# Patient Record
Sex: Male | Born: 1960 | Race: White | Hispanic: No | Marital: Married
Health system: Southern US, Community
[De-identification: ages and names within clinical notes are randomized; demographics above are authoritative.]

---

## 2006-06-02 ENCOUNTER — Emergency Department (HOSPITAL_COMMUNITY): Admission: EM | Admit: 2006-06-02 | Discharge: 2006-06-02 | Payer: Self-pay | Admitting: Emergency Medicine

## 2013-01-15 ENCOUNTER — Emergency Department: Payer: Self-pay | Admitting: Emergency Medicine

## 2013-01-15 LAB — CBC
HCT: 42.5 % (ref 40.0–52.0)
MCH: 35 pg — ABNORMAL HIGH (ref 26.0–34.0)
MCV: 99 fL (ref 80–100)
Platelet: 210 10*3/uL (ref 150–440)
RBC: 4.29 10*6/uL — ABNORMAL LOW (ref 4.40–5.90)
RDW: 12.9 % (ref 11.5–14.5)

## 2013-01-15 LAB — BASIC METABOLIC PANEL
Chloride: 107 mmol/L (ref 98–107)
Creatinine: 0.98 mg/dL (ref 0.60–1.30)
EGFR (African American): >60
EGFR (Non-African Amer.): >60
Glucose: 126 mg/dL — ABNORMAL HIGH (ref 65–99)

## 2013-01-15 LAB — TROPONIN I: Troponin-I: <0.02 ng/mL

## 2013-01-15 LAB — CK TOTAL AND CKMB (NOT AT ARMC): CK, Total: 110 U/L (ref 35–232)

## 2013-02-16 ENCOUNTER — Ambulatory Visit: Payer: Self-pay | Admitting: Internal Medicine

## 2013-04-08 ENCOUNTER — Emergency Department: Payer: Self-pay | Admitting: Emergency Medicine

## 2013-04-08 LAB — BASIC METABOLIC PANEL
Anion Gap: 7 (ref 7–16)
BUN: 15 mg/dL (ref 7–18)
Chloride: 104 mmol/L (ref 98–107)
Co2: 25 mmol/L (ref 21–32)
EGFR (African American): >60
EGFR (Non-African Amer.): >60
Osmolality: 272 (ref 275–301)

## 2013-04-08 LAB — CBC
HCT: 47 % (ref 40.0–52.0)
HGB: 16.1 g/dL (ref 13.0–18.0)
MCH: 34.3 pg — ABNORMAL HIGH (ref 26.0–34.0)
RBC: 4.69 10*6/uL (ref 4.40–5.90)
WBC: 11.8 10*3/uL — ABNORMAL HIGH (ref 3.8–10.6)

## 2013-04-08 LAB — CK TOTAL AND CKMB (NOT AT ARMC): CK-MB: <0.5 ng/mL — ABNORMAL LOW (ref 0.5–3.6)

## 2013-06-01 ENCOUNTER — Emergency Department: Payer: Self-pay

## 2013-06-01 LAB — COMPREHENSIVE METABOLIC PANEL
Albumin: 3.7 g/dL (ref 3.4–5.0)
Alkaline Phosphatase: 70 U/L (ref 50–136)
Anion Gap: 6 — ABNORMAL LOW (ref 7–16)
BUN: 15 mg/dL (ref 7–18)
Calcium, Total: 8.9 mg/dL (ref 8.5–10.1)
Chloride: 105 mmol/L (ref 98–107)
Co2: 25 mmol/L (ref 21–32)
EGFR (African American): >60
Glucose: 99 mg/dL (ref 65–99)
Osmolality: 273 (ref 275–301)
Potassium: 3.9 mmol/L (ref 3.5–5.1)
Sodium: 136 mmol/L (ref 136–145)
Total Protein: 7.4 g/dL (ref 6.4–8.2)

## 2013-06-01 LAB — CBC
HCT: 42.1 % (ref 40.0–52.0)
HGB: 14.8 g/dL (ref 13.0–18.0)
MCH: 34.7 pg — ABNORMAL HIGH (ref 26.0–34.0)
Platelet: 226 10*3/uL (ref 150–440)
RBC: 4.27 10*6/uL — ABNORMAL LOW (ref 4.40–5.90)
WBC: 7.6 10*3/uL (ref 3.8–10.6)

## 2013-07-11 ENCOUNTER — Inpatient Hospital Stay: Payer: Self-pay | Admitting: Specialist

## 2013-07-11 LAB — DRUG SCREEN, URINE
Barbiturates, Ur Screen: NEGATIVE (ref ?–200)
Benzodiazepine, Ur Scrn: NEGATIVE (ref ?–200)
Cannabinoid 50 Ng, Ur ~~LOC~~: POSITIVE (ref ?–50)
Cocaine Metabolite,Ur ~~LOC~~: NEGATIVE (ref ?–300)
Tricyclic, Ur Screen: NEGATIVE (ref ?–1000)

## 2013-07-11 LAB — URINALYSIS, COMPLETE
Bilirubin,UR: NEGATIVE
Blood: NEGATIVE
Hyaline Cast: 1
Leukocyte Esterase: NEGATIVE
Ph: 5 (ref 4.5–8.0)
Specific Gravity: 1.004 (ref 1.003–1.030)
Squamous Epithelial: NONE SEEN
WBC UR: 1 /HPF (ref 0–5)

## 2013-07-11 LAB — CBC
HCT: 45.1 % (ref 40.0–52.0)
HGB: 16 g/dL (ref 13.0–18.0)
MCH: 34.7 pg — ABNORMAL HIGH (ref 26.0–34.0)
Platelet: 313 10*3/uL (ref 150–440)
RBC: 4.61 10*6/uL (ref 4.40–5.90)
WBC: 8.6 10*3/uL (ref 3.8–10.6)

## 2013-07-11 LAB — ETHANOL: Ethanol %: 0.137 % — ABNORMAL HIGH (ref 0.000–0.080)

## 2013-07-11 LAB — COMPREHENSIVE METABOLIC PANEL
Anion Gap: 10 (ref 7–16)
Creatinine: 0.94 mg/dL (ref 0.60–1.30)
Glucose: 115 mg/dL — ABNORMAL HIGH (ref 65–99)
Osmolality: 274 (ref 275–301)
SGPT (ALT): 36 U/L (ref 12–78)
Total Protein: 8.1 g/dL (ref 6.4–8.2)

## 2013-07-11 LAB — SALICYLATE LEVEL: Salicylates, Serum: 12.8 mg/dL — ABNORMAL HIGH

## 2013-07-12 LAB — COMPREHENSIVE METABOLIC PANEL
Albumin: 3.3 g/dL — ABNORMAL LOW (ref 3.4–5.0)
Anion Gap: 5 — ABNORMAL LOW (ref 7–16)
BUN: 10 mg/dL (ref 7–18)
Bilirubin,Total: 0.4 mg/dL (ref 0.2–1.0)
Calcium, Total: 7.8 mg/dL — ABNORMAL LOW (ref 8.5–10.1)
Co2: 26 mmol/L (ref 21–32)
EGFR (African American): >60
Osmolality: 279 (ref 275–301)
Potassium: 4 mmol/L (ref 3.5–5.1)

## 2013-07-12 LAB — CBC WITH DIFFERENTIAL/PLATELET
Basophil #: 0 10*3/uL (ref 0.0–0.1)
Eosinophil #: 0.2 10*3/uL (ref 0.0–0.7)
HCT: 41 % (ref 40.0–52.0)
Lymphocyte #: 1.4 10*3/uL (ref 1.0–3.6)
Lymphocyte %: 19.6 %
Monocyte %: 7.4 %
Neutrophil %: 70.7 %
Platelet: 234 10*3/uL (ref 150–440)
RBC: 4.14 10*6/uL — ABNORMAL LOW (ref 4.40–5.90)
RDW: 13.5 % (ref 11.5–14.5)
WBC: 7.4 10*3/uL (ref 3.8–10.6)

## 2013-07-13 LAB — BASIC METABOLIC PANEL
Anion Gap: 5 — ABNORMAL LOW (ref 7–16)
Calcium, Total: 7.6 mg/dL — ABNORMAL LOW (ref 8.5–10.1)
Co2: 26 mmol/L (ref 21–32)
Creatinine: 0.89 mg/dL (ref 0.60–1.30)
EGFR (African American): >60
Osmolality: 276 (ref 275–301)

## 2013-07-13 LAB — CBC WITH DIFFERENTIAL/PLATELET
HCT: 38.7 % — ABNORMAL LOW (ref 40.0–52.0)
HGB: 13.8 g/dL (ref 13.0–18.0)
Lymphocyte #: 1.4 10*3/uL (ref 1.0–3.6)
Lymphocyte %: 22.5 %
Monocyte %: 8.9 %
Neutrophil #: 4 10*3/uL (ref 1.4–6.5)
Platelet: 214 10*3/uL (ref 150–440)
RDW: 13.2 % (ref 11.5–14.5)
WBC: 6.3 10*3/uL (ref 3.8–10.6)

## 2013-07-14 LAB — CBC WITH DIFFERENTIAL/PLATELET
Basophil #: 0.2 10*3/uL — ABNORMAL HIGH (ref 0.0–0.1)
Basophil %: 2.3 %
Eosinophil #: 0.2 10*3/uL (ref 0.0–0.7)
Eosinophil %: 2.7 %
Lymphocyte %: 16.6 %
MCV: 97 fL (ref 80–100)
Monocyte #: 0.5 x10 3/mm (ref 0.2–1.0)
Neutrophil %: 72.1 %
RDW: 13.1 % (ref 11.5–14.5)

## 2013-07-14 LAB — BASIC METABOLIC PANEL
BUN: 7 mg/dL (ref 7–18)
Calcium, Total: 7.7 mg/dL — ABNORMAL LOW (ref 8.5–10.1)
Co2: 24 mmol/L (ref 21–32)
Creatinine: 0.79 mg/dL (ref 0.60–1.30)
EGFR (Non-African Amer.): >60
Osmolality: 276 (ref 275–301)
Sodium: 138 mmol/L (ref 136–145)

## 2013-07-16 LAB — BASIC METABOLIC PANEL
Calcium, Total: 8.2 mg/dL — ABNORMAL LOW (ref 8.5–10.1)
Chloride: 107 mmol/L (ref 98–107)
Creatinine: 0.78 mg/dL (ref 0.60–1.30)
EGFR (African American): >60
EGFR (Non-African Amer.): >60
Potassium: 4.7 mmol/L (ref 3.5–5.1)

## 2013-07-16 LAB — MAGNESIUM: Magnesium: 2.3 mg/dL

## 2013-07-16 LAB — TRIGLYCERIDES: Triglycerides: 912 mg/dL — ABNORMAL HIGH (ref 0–200)

## 2013-07-17 LAB — SODIUM: Sodium: 142 mmol/L (ref 136–145)

## 2013-07-18 LAB — CBC WITH DIFFERENTIAL/PLATELET
Basophil #: 0 10*3/uL (ref 0.0–0.1)
Eosinophil #: 0 10*3/uL (ref 0.0–0.7)
Eosinophil %: 0 %
Lymphocyte #: 0.5 10*3/uL — ABNORMAL LOW (ref 1.0–3.6)
Lymphocyte %: 4.5 %
MCH: 34.4 pg — ABNORMAL HIGH (ref 26.0–34.0)
MCHC: 34.6 g/dL (ref 32.0–36.0)
MCV: 99 fL (ref 80–100)
Neutrophil #: 9.2 10*3/uL — ABNORMAL HIGH (ref 1.4–6.5)
Neutrophil %: 89.6 %
Platelet: 189 10*3/uL (ref 150–440)
RBC: 3.75 10*6/uL — ABNORMAL LOW (ref 4.40–5.90)
RDW: 12.7 % (ref 11.5–14.5)
WBC: 10.2 10*3/uL (ref 3.8–10.6)

## 2013-07-18 LAB — COMPREHENSIVE METABOLIC PANEL
Albumin: 2.9 g/dL — ABNORMAL LOW (ref 3.4–5.0)
Alkaline Phosphatase: 63 U/L (ref 50–136)
Anion Gap: 1 — ABNORMAL LOW (ref 7–16)
Calcium, Total: 8.6 mg/dL (ref 8.5–10.1)
Co2: 35 mmol/L — ABNORMAL HIGH (ref 21–32)
Creatinine: 0.68 mg/dL (ref 0.60–1.30)
EGFR (African American): >60
Osmolality: 285 (ref 275–301)
SGOT(AST): 94 U/L — ABNORMAL HIGH (ref 15–37)
SGPT (ALT): 75 U/L (ref 12–78)
Sodium: 138 mmol/L (ref 136–145)

## 2013-07-18 LAB — MAGNESIUM: Magnesium: 2.3 mg/dL

## 2013-07-20 LAB — BASIC METABOLIC PANEL
Anion Gap: 9 (ref 7–16)
Calcium, Total: 9.4 mg/dL (ref 8.5–10.1)
Chloride: 95 mmol/L — ABNORMAL LOW (ref 98–107)
Creatinine: 0.75 mg/dL (ref 0.60–1.30)
EGFR (African American): >60
EGFR (Non-African Amer.): >60
Glucose: 170 mg/dL — ABNORMAL HIGH (ref 65–99)
Osmolality: 274 (ref 275–301)
Potassium: 3.5 mmol/L (ref 3.5–5.1)

## 2013-07-20 LAB — MAGNESIUM: Magnesium: 2 mg/dL

## 2013-07-21 LAB — BASIC METABOLIC PANEL
BUN: 35 mg/dL — ABNORMAL HIGH (ref 7–18)
Calcium, Total: 9.1 mg/dL (ref 8.5–10.1)
Creatinine: 0.87 mg/dL (ref 0.60–1.30)
EGFR (African American): >60

## 2013-07-22 ENCOUNTER — Inpatient Hospital Stay: Payer: Self-pay | Admitting: Psychiatry

## 2013-07-22 LAB — POTASSIUM: Potassium: 3.7 mmol/L (ref 3.5–5.1)

## 2013-09-16 ENCOUNTER — Emergency Department: Payer: Self-pay | Admitting: Emergency Medicine

## 2013-09-16 LAB — CBC
HCT: 45.4 % (ref 40.0–52.0)
HGB: 16 g/dL (ref 13.0–18.0)
MCV: 96 fL (ref 80–100)
RBC: 4.74 10*6/uL (ref 4.40–5.90)

## 2013-09-16 LAB — URINALYSIS, COMPLETE
Bacteria: NONE SEEN
Bilirubin,UR: NEGATIVE
Blood: NEGATIVE
Glucose,UR: NEGATIVE mg/dL (ref 0–75)
Ketone: NEGATIVE
Leukocyte Esterase: NEGATIVE
Nitrite: NEGATIVE
Ph: 5 (ref 4.5–8.0)
Specific Gravity: 1.003 (ref 1.003–1.030)

## 2013-09-16 LAB — DRUG SCREEN, URINE
Amphetamines, Ur Screen: NEGATIVE (ref ?–1000)
Cannabinoid 50 Ng, Ur ~~LOC~~: NEGATIVE (ref ?–50)
Cocaine Metabolite,Ur ~~LOC~~: NEGATIVE (ref ?–300)
Opiate, Ur Screen: NEGATIVE (ref ?–300)
Phencyclidine (PCP) Ur S: NEGATIVE (ref ?–25)

## 2013-09-16 LAB — COMPREHENSIVE METABOLIC PANEL
Albumin: 4.3 g/dL (ref 3.4–5.0)
Anion Gap: 11 (ref 7–16)
BUN: 12 mg/dL (ref 7–18)
Bilirubin,Total: 0.5 mg/dL (ref 0.2–1.0)
Calcium, Total: 9.1 mg/dL (ref 8.5–10.1)
Chloride: 106 mmol/L (ref 98–107)
Co2: 21 mmol/L (ref 21–32)
EGFR (African American): >60
Osmolality: 276 (ref 275–301)
SGPT (ALT): 40 U/L (ref 12–78)
Sodium: 138 mmol/L (ref 136–145)
Total Protein: 8.2 g/dL (ref 6.4–8.2)

## 2013-09-16 LAB — ETHANOL: Ethanol: 99 mg/dL

## 2013-09-17 LAB — SALICYLATE LEVEL: Salicylates, Serum: 10.7 mg/dL — ABNORMAL HIGH

## 2013-12-20 ENCOUNTER — Emergency Department: Payer: Self-pay | Admitting: Emergency Medicine

## 2013-12-20 LAB — CBC WITH DIFFERENTIAL/PLATELET
Eosinophil #: 0.1 10*3/uL (ref 0.0–0.7)
Lymphocyte %: 17.3 %
MCH: 33.2 pg (ref 26.0–34.0)
MCHC: 35 g/dL (ref 32.0–36.0)
MCV: 95 fL (ref 80–100)
Monocyte #: 0.5 x10 3/mm (ref 0.2–1.0)
Monocyte %: 5.4 %
Neutrophil #: 7.6 10*3/uL — ABNORMAL HIGH (ref 1.4–6.5)
RBC: 4.93 10*6/uL (ref 4.40–5.90)

## 2013-12-20 LAB — BASIC METABOLIC PANEL
Anion Gap: 4 — ABNORMAL LOW (ref 7–16)
Chloride: 100 mmol/L (ref 98–107)
EGFR (Non-African Amer.): >60
Osmolality: 266 (ref 275–301)
Potassium: 3.6 mmol/L (ref 3.5–5.1)
Sodium: 130 mmol/L — ABNORMAL LOW (ref 136–145)

## 2013-12-20 LAB — CK: CK, Total: 77 U/L (ref 35–232)

## 2014-01-18 LAB — DRUG SCREEN, URINE
Amphetamines, Ur Screen: NEGATIVE (ref ?–1000)
BARBITURATES, UR SCREEN: NEGATIVE (ref ?–200)
Benzodiazepine, Ur Scrn: POSITIVE (ref ?–200)
CANNABINOID 50 NG, UR ~~LOC~~: NEGATIVE (ref ?–50)
COCAINE METABOLITE, UR ~~LOC~~: NEGATIVE (ref ?–300)
MDMA (Ecstasy)Ur Screen: NEGATIVE (ref ?–500)
Methadone, Ur Screen: NEGATIVE (ref ?–300)
Opiate, Ur Screen: NEGATIVE (ref ?–300)
Phencyclidine (PCP) Ur S: NEGATIVE (ref ?–25)
TRICYCLIC, UR SCREEN: NEGATIVE (ref ?–1000)

## 2014-01-18 LAB — ETHANOL: Ethanol %: <0.003 % (ref 0.000–0.080)

## 2014-01-18 LAB — CBC
HCT: 51.3 % (ref 40.0–52.0)
HGB: 17.9 g/dL (ref 13.0–18.0)
MCH: 33.8 pg (ref 26.0–34.0)
MCHC: 34.8 g/dL (ref 32.0–36.0)
MCV: 97 fL (ref 80–100)
Platelet: 295 10*3/uL (ref 150–440)
RBC: 5.29 10*6/uL (ref 4.40–5.90)
RDW: 14.2 % (ref 11.5–14.5)
WBC: 7.8 10*3/uL (ref 3.8–10.6)

## 2014-01-18 LAB — COMPREHENSIVE METABOLIC PANEL
ALBUMIN: 4.8 g/dL (ref 3.4–5.0)
ANION GAP: 3 — AB (ref 7–16)
AST: 32 U/L (ref 15–37)
Alkaline Phosphatase: 99 U/L
BUN: 28 mg/dL — ABNORMAL HIGH (ref 7–18)
Bilirubin,Total: 0.8 mg/dL (ref 0.2–1.0)
CO2: 31 mmol/L (ref 21–32)
Calcium, Total: 9.3 mg/dL (ref 8.5–10.1)
Chloride: 97 mmol/L — ABNORMAL LOW (ref 98–107)
Creatinine: 1.33 mg/dL — ABNORMAL HIGH (ref 0.60–1.30)
EGFR (African American): >60
GLUCOSE: 100 mg/dL — AB (ref 65–99)
OSMOLALITY: 268 (ref 275–301)
Potassium: 3.7 mmol/L (ref 3.5–5.1)
SGPT (ALT): 31 U/L (ref 12–78)
Sodium: 131 mmol/L — ABNORMAL LOW (ref 136–145)
Total Protein: 8.7 g/dL — ABNORMAL HIGH (ref 6.4–8.2)

## 2014-01-18 LAB — URINALYSIS, COMPLETE
BLOOD: NEGATIVE
Bacteria: NONE SEEN
Bilirubin,UR: NEGATIVE
Glucose,UR: NEGATIVE mg/dL (ref 0–75)
KETONE: NEGATIVE
LEUKOCYTE ESTERASE: NEGATIVE
Nitrite: NEGATIVE
PH: 5 (ref 4.5–8.0)
PROTEIN: NEGATIVE
Specific Gravity: 1.011 (ref 1.003–1.030)
Squamous Epithelial: <1
WBC UR: <1 /HPF (ref 0–5)

## 2014-01-18 LAB — SALICYLATE LEVEL: Salicylates, Serum: 11 mg/dL — ABNORMAL HIGH

## 2014-01-18 LAB — TSH: Thyroid Stimulating Horm: 2.52 u[IU]/mL

## 2014-01-18 LAB — ACETAMINOPHEN LEVEL

## 2014-01-19 ENCOUNTER — Inpatient Hospital Stay: Payer: Self-pay | Admitting: Psychiatry

## 2014-04-16 ENCOUNTER — Emergency Department: Payer: Self-pay | Admitting: Emergency Medicine

## 2014-04-16 LAB — COMPREHENSIVE METABOLIC PANEL
ALBUMIN: 3.8 g/dL (ref 3.4–5.0)
ANION GAP: 7 (ref 7–16)
Alkaline Phosphatase: 81 U/L
BUN: 15 mg/dL (ref 7–18)
Bilirubin,Total: 0.3 mg/dL (ref 0.2–1.0)
CREATININE: 0.99 mg/dL (ref 0.60–1.30)
Calcium, Total: 8.9 mg/dL (ref 8.5–10.1)
Chloride: 103 mmol/L (ref 98–107)
Co2: 27 mmol/L (ref 21–32)
Glucose: 153 mg/dL — ABNORMAL HIGH (ref 65–99)
Osmolality: 278 (ref 275–301)
POTASSIUM: 3.7 mmol/L (ref 3.5–5.1)
SGOT(AST): 24 U/L (ref 15–37)
SGPT (ALT): 35 U/L (ref 12–78)
Sodium: 137 mmol/L (ref 136–145)
Total Protein: 7.4 g/dL (ref 6.4–8.2)

## 2014-04-16 LAB — CBC
HCT: 44.4 % (ref 40.0–52.0)
HGB: 15.4 g/dL (ref 13.0–18.0)
MCH: 34.3 pg — AB (ref 26.0–34.0)
MCHC: 34.7 g/dL (ref 32.0–36.0)
MCV: 99 fL (ref 80–100)
PLATELETS: 225 10*3/uL (ref 150–440)
RBC: 4.49 10*6/uL (ref 4.40–5.90)
RDW: 14 % (ref 11.5–14.5)
WBC: 7.5 10*3/uL (ref 3.8–10.6)

## 2014-04-16 LAB — ACETAMINOPHEN LEVEL

## 2014-04-16 LAB — DRUG SCREEN, URINE
Amphetamines, Ur Screen: NEGATIVE (ref ?–1000)
Barbiturates, Ur Screen: NEGATIVE (ref ?–200)
Benzodiazepine, Ur Scrn: POSITIVE (ref ?–200)
Cannabinoid 50 Ng, Ur ~~LOC~~: POSITIVE (ref ?–50)
Cocaine Metabolite,Ur ~~LOC~~: NEGATIVE (ref ?–300)
MDMA (ECSTASY) UR SCREEN: NEGATIVE (ref ?–500)
METHADONE, UR SCREEN: NEGATIVE (ref ?–300)
Opiate, Ur Screen: NEGATIVE (ref ?–300)
PHENCYCLIDINE (PCP) UR S: NEGATIVE (ref ?–25)
Tricyclic, Ur Screen: NEGATIVE (ref ?–1000)

## 2014-04-16 LAB — ETHANOL: Ethanol %: <0.003 % (ref 0.000–0.080)

## 2014-04-16 LAB — SALICYLATE LEVEL
SALICYLATES, SERUM: 4.6 mg/dL — AB
Salicylates, Serum: 4.3 mg/dL — ABNORMAL HIGH

## 2014-04-16 LAB — TSH: Thyroid Stimulating Horm: 3.18 u[IU]/mL

## 2014-04-17 ENCOUNTER — Emergency Department: Payer: Self-pay | Admitting: Emergency Medicine

## 2014-04-17 LAB — CBC
HCT: 47.3 % (ref 40.0–52.0)
HGB: 16.4 g/dL (ref 13.0–18.0)
MCH: 33.8 pg (ref 26.0–34.0)
MCHC: 34.6 g/dL (ref 32.0–36.0)
MCV: 98 fL (ref 80–100)
PLATELETS: 257 10*3/uL (ref 150–440)
RBC: 4.86 10*6/uL (ref 4.40–5.90)
RDW: 13.8 % (ref 11.5–14.5)
WBC: 7.3 10*3/uL (ref 3.8–10.6)

## 2014-04-17 LAB — COMPREHENSIVE METABOLIC PANEL
ALBUMIN: 4.4 g/dL (ref 3.4–5.0)
AST: 37 U/L (ref 15–37)
Alkaline Phosphatase: 87 U/L
Anion Gap: 8 (ref 7–16)
BUN: 13 mg/dL (ref 7–18)
Bilirubin,Total: 0.5 mg/dL (ref 0.2–1.0)
CHLORIDE: 102 mmol/L (ref 98–107)
CO2: 27 mmol/L (ref 21–32)
Calcium, Total: 9 mg/dL (ref 8.5–10.1)
Creatinine: 0.7 mg/dL (ref 0.60–1.30)
EGFR (African American): >60
EGFR (Non-African Amer.): >60
Glucose: 120 mg/dL — ABNORMAL HIGH (ref 65–99)
OSMOLALITY: 275 (ref 275–301)
Potassium: 3.4 mmol/L — ABNORMAL LOW (ref 3.5–5.1)
SGPT (ALT): 40 U/L (ref 12–78)
Sodium: 137 mmol/L (ref 136–145)
TOTAL PROTEIN: 8.2 g/dL (ref 6.4–8.2)

## 2014-04-17 LAB — URINALYSIS, COMPLETE
BILIRUBIN, UR: NEGATIVE
Bacteria: NONE SEEN
Blood: NEGATIVE
Glucose,UR: NEGATIVE mg/dL (ref 0–75)
Ketone: NEGATIVE
LEUKOCYTE ESTERASE: NEGATIVE
Nitrite: NEGATIVE
PROTEIN: NEGATIVE
Ph: 5 (ref 4.5–8.0)
RBC, UR: NONE SEEN /HPF (ref 0–5)
SPECIFIC GRAVITY: 1.003 (ref 1.003–1.030)
SQUAMOUS EPITHELIAL: NONE SEEN
WBC UR: NONE SEEN /HPF (ref 0–5)

## 2014-04-17 LAB — DRUG SCREEN, URINE
Amphetamines, Ur Screen: NEGATIVE (ref ?–1000)
BENZODIAZEPINE, UR SCRN: NEGATIVE (ref ?–200)
Barbiturates, Ur Screen: NEGATIVE (ref ?–200)
CANNABINOID 50 NG, UR ~~LOC~~: POSITIVE (ref ?–50)
COCAINE METABOLITE, UR ~~LOC~~: NEGATIVE (ref ?–300)
MDMA (ECSTASY) UR SCREEN: NEGATIVE (ref ?–500)
Methadone, Ur Screen: NEGATIVE (ref ?–300)
Opiate, Ur Screen: NEGATIVE (ref ?–300)
Phencyclidine (PCP) Ur S: NEGATIVE (ref ?–25)
Tricyclic, Ur Screen: NEGATIVE (ref ?–1000)

## 2014-04-17 LAB — ACETAMINOPHEN LEVEL: Acetaminophen: <2 ug/mL

## 2014-04-17 LAB — ETHANOL
ETHANOL %: 0.134 % — AB (ref 0.000–0.080)
Ethanol: 134 mg/dL

## 2014-04-17 LAB — SALICYLATE LEVEL: Salicylates, Serum: 8.5 mg/dL — ABNORMAL HIGH

## 2014-04-17 LAB — LIPASE, BLOOD: LIPASE: 274 U/L (ref 73–393)

## 2014-04-17 LAB — TROPONIN I: Troponin-I: <0.02 ng/mL

## 2014-04-17 LAB — MAGNESIUM: Magnesium: 2.4 mg/dL

## 2014-06-11 ENCOUNTER — Emergency Department: Payer: Self-pay | Admitting: Emergency Medicine

## 2014-06-11 LAB — ETHANOL
Ethanol %: <0.003 % (ref 0.000–0.080)
Ethanol: <3 mg/dL

## 2014-06-11 LAB — COMPREHENSIVE METABOLIC PANEL
ALBUMIN: 4.5 g/dL (ref 3.4–5.0)
ALK PHOS: 92 U/L
AST: 22 U/L (ref 15–37)
Anion Gap: 6 — ABNORMAL LOW (ref 7–16)
BUN: 11 mg/dL (ref 7–18)
Bilirubin,Total: 0.6 mg/dL (ref 0.2–1.0)
CO2: 30 mmol/L (ref 21–32)
Calcium, Total: 9.5 mg/dL (ref 8.5–10.1)
Chloride: 98 mmol/L (ref 98–107)
Creatinine: 1 mg/dL (ref 0.60–1.30)
Glucose: 92 mg/dL (ref 65–99)
OSMOLALITY: 267 (ref 275–301)
Potassium: 3.7 mmol/L (ref 3.5–5.1)
SGPT (ALT): 37 U/L (ref 12–78)
Sodium: 134 mmol/L — ABNORMAL LOW (ref 136–145)
TOTAL PROTEIN: 8.6 g/dL — AB (ref 6.4–8.2)

## 2014-06-11 LAB — CBC
HCT: 48.1 % (ref 40.0–52.0)
HGB: 16.1 g/dL (ref 13.0–18.0)
MCH: 32.9 pg (ref 26.0–34.0)
MCHC: 33.5 g/dL (ref 32.0–36.0)
MCV: 98 fL (ref 80–100)
Platelet: 266 10*3/uL (ref 150–440)
RBC: 4.9 10*6/uL (ref 4.40–5.90)
RDW: 13.8 % (ref 11.5–14.5)
WBC: 9.3 10*3/uL (ref 3.8–10.6)

## 2014-06-11 LAB — URINALYSIS, COMPLETE
BACTERIA: NONE SEEN
BILIRUBIN, UR: NEGATIVE
BLOOD: NEGATIVE
Glucose,UR: NEGATIVE mg/dL (ref 0–75)
KETONE: NEGATIVE
LEUKOCYTE ESTERASE: NEGATIVE
NITRITE: NEGATIVE
PH: 5 (ref 4.5–8.0)
PROTEIN: NEGATIVE
RBC, UR: NONE SEEN /HPF (ref 0–5)
Specific Gravity: 1.011 (ref 1.003–1.030)

## 2014-06-11 LAB — DRUG SCREEN, URINE
AMPHETAMINES, UR SCREEN: NEGATIVE (ref ?–1000)
BARBITURATES, UR SCREEN: NEGATIVE (ref ?–200)
BENZODIAZEPINE, UR SCRN: POSITIVE (ref ?–200)
COCAINE METABOLITE, UR ~~LOC~~: NEGATIVE (ref ?–300)
Cannabinoid 50 Ng, Ur ~~LOC~~: POSITIVE (ref ?–50)
MDMA (Ecstasy)Ur Screen: NEGATIVE (ref ?–500)
Methadone, Ur Screen: NEGATIVE (ref ?–300)
Opiate, Ur Screen: NEGATIVE (ref ?–300)
Phencyclidine (PCP) Ur S: NEGATIVE (ref ?–25)
Tricyclic, Ur Screen: NEGATIVE (ref ?–1000)

## 2014-06-11 LAB — SALICYLATE LEVEL: SALICYLATES, SERUM: 4.8 mg/dL — AB

## 2014-06-11 LAB — ACETAMINOPHEN LEVEL

## 2014-06-17 ENCOUNTER — Emergency Department: Payer: Self-pay | Admitting: Emergency Medicine

## 2014-07-04 ENCOUNTER — Inpatient Hospital Stay: Payer: Self-pay | Admitting: Internal Medicine

## 2014-07-04 ENCOUNTER — Emergency Department: Payer: Self-pay | Admitting: Emergency Medicine

## 2014-07-04 LAB — COMPREHENSIVE METABOLIC PANEL
Albumin: 4.2 g/dL (ref 3.4–5.0)
Alkaline Phosphatase: 70 U/L
Anion Gap: 10 (ref 7–16)
BUN: 11 mg/dL (ref 7–18)
Bilirubin,Total: 0.4 mg/dL (ref 0.2–1.0)
CHLORIDE: 101 mmol/L (ref 98–107)
CREATININE: 0.92 mg/dL (ref 0.60–1.30)
Calcium, Total: 8.8 mg/dL (ref 8.5–10.1)
Co2: 27 mmol/L (ref 21–32)
EGFR (Non-African Amer.): >60
GLUCOSE: 103 mg/dL — AB (ref 65–99)
OSMOLALITY: 275 (ref 275–301)
Potassium: 3.3 mmol/L — ABNORMAL LOW (ref 3.5–5.1)
SGOT(AST): 29 U/L (ref 15–37)
SGPT (ALT): 43 U/L (ref 12–78)
Sodium: 138 mmol/L (ref 136–145)
Total Protein: 7.9 g/dL (ref 6.4–8.2)

## 2014-07-04 LAB — URINALYSIS, COMPLETE
BACTERIA: NONE SEEN
BILIRUBIN, UR: NEGATIVE
BLOOD: NEGATIVE
Glucose,UR: NEGATIVE mg/dL (ref 0–75)
KETONE: NEGATIVE
LEUKOCYTE ESTERASE: NEGATIVE
NITRITE: NEGATIVE
PH: 5 (ref 4.5–8.0)
PROTEIN: NEGATIVE
Specific Gravity: 1.008 (ref 1.003–1.030)
Squamous Epithelial: <1
WBC UR: 1 /HPF (ref 0–5)

## 2014-07-04 LAB — POTASSIUM: Potassium: 3.9 mmol/L (ref 3.5–5.1)

## 2014-07-04 LAB — DRUG SCREEN, URINE
AMPHETAMINES, UR SCREEN: NEGATIVE (ref ?–1000)
BENZODIAZEPINE, UR SCRN: POSITIVE (ref ?–200)
Barbiturates, Ur Screen: NEGATIVE (ref ?–200)
COCAINE METABOLITE, UR ~~LOC~~: POSITIVE (ref ?–300)
Cannabinoid 50 Ng, Ur ~~LOC~~: POSITIVE (ref ?–50)
MDMA (Ecstasy)Ur Screen: NEGATIVE (ref ?–500)
Methadone, Ur Screen: NEGATIVE (ref ?–300)
Opiate, Ur Screen: NEGATIVE (ref ?–300)
Phencyclidine (PCP) Ur S: NEGATIVE (ref ?–25)
Tricyclic, Ur Screen: NEGATIVE (ref ?–1000)

## 2014-07-04 LAB — CBC
HCT: 46.4 % (ref 40.0–52.0)
HGB: 16 g/dL (ref 13.0–18.0)
MCH: 33.8 pg (ref 26.0–34.0)
MCHC: 34.6 g/dL (ref 32.0–36.0)
MCV: 98 fL (ref 80–100)
Platelet: 281 10*3/uL (ref 150–440)
RBC: 4.74 10*6/uL (ref 4.40–5.90)
RDW: 13.9 % (ref 11.5–14.5)
WBC: 11 10*3/uL — ABNORMAL HIGH (ref 3.8–10.6)

## 2014-07-04 LAB — SALICYLATE LEVEL
SALICYLATES, SERUM: 8.1 mg/dL — AB
Salicylates, Serum: 15.6 mg/dL — ABNORMAL HIGH

## 2014-07-04 LAB — TSH: THYROID STIMULATING HORM: 1.95 u[IU]/mL

## 2014-07-04 LAB — ETHANOL
ETHANOL %: 0.035 % (ref 0.000–0.080)
Ethanol: 35 mg/dL

## 2014-07-04 LAB — ACETAMINOPHEN LEVEL: Acetaminophen: <2 ug/mL

## 2014-07-05 LAB — CBC WITH DIFFERENTIAL/PLATELET
BASOS PCT: 0.9 %
Basophil #: 0.1 10*3/uL (ref 0.0–0.1)
Eosinophil #: 0.1 10*3/uL (ref 0.0–0.7)
Eosinophil %: 1.2 %
HCT: 40.5 % (ref 40.0–52.0)
HGB: 13.8 g/dL (ref 13.0–18.0)
LYMPHS PCT: 19.3 %
Lymphocyte #: 1.9 10*3/uL (ref 1.0–3.6)
MCH: 33.7 pg (ref 26.0–34.0)
MCHC: 34.1 g/dL (ref 32.0–36.0)
MCV: 99 fL (ref 80–100)
MONO ABS: 0.6 x10 3/mm (ref 0.2–1.0)
MONOS PCT: 6.4 %
NEUTROS ABS: 6.9 10*3/uL — AB (ref 1.4–6.5)
Neutrophil %: 72.2 %
PLATELETS: 244 10*3/uL (ref 150–440)
RBC: 4.09 10*6/uL — ABNORMAL LOW (ref 4.40–5.90)
RDW: 14.2 % (ref 11.5–14.5)
WBC: 9.6 10*3/uL (ref 3.8–10.6)

## 2014-07-05 LAB — COMPREHENSIVE METABOLIC PANEL
Albumin: 3.3 g/dL — ABNORMAL LOW (ref 3.4–5.0)
Alkaline Phosphatase: 72 U/L
Anion Gap: 13 (ref 7–16)
BUN: 12 mg/dL (ref 7–18)
Bilirubin,Total: 0.5 mg/dL (ref 0.2–1.0)
CALCIUM: 8 mg/dL — AB (ref 8.5–10.1)
CO2: 22 mmol/L (ref 21–32)
Chloride: 103 mmol/L (ref 98–107)
Creatinine: 0.98 mg/dL (ref 0.60–1.30)
EGFR (Non-African Amer.): >60
Glucose: 103 mg/dL — ABNORMAL HIGH (ref 65–99)
Osmolality: 276 (ref 275–301)
Potassium: 3.6 mmol/L (ref 3.5–5.1)
SGOT(AST): 28 U/L (ref 15–37)
SGPT (ALT): 34 U/L (ref 12–78)
Sodium: 138 mmol/L (ref 136–145)
TOTAL PROTEIN: 6.3 g/dL — AB (ref 6.4–8.2)

## 2014-07-06 ENCOUNTER — Inpatient Hospital Stay: Payer: Self-pay | Admitting: Psychiatry

## 2014-08-27 IMAGING — CR DG CHEST 1V PORT
1 series · 1 of 1 positions shown · non-contrast
Comparison: none

REASON FOR EXAM: s/p intubation
COMMENTS:

[ap]
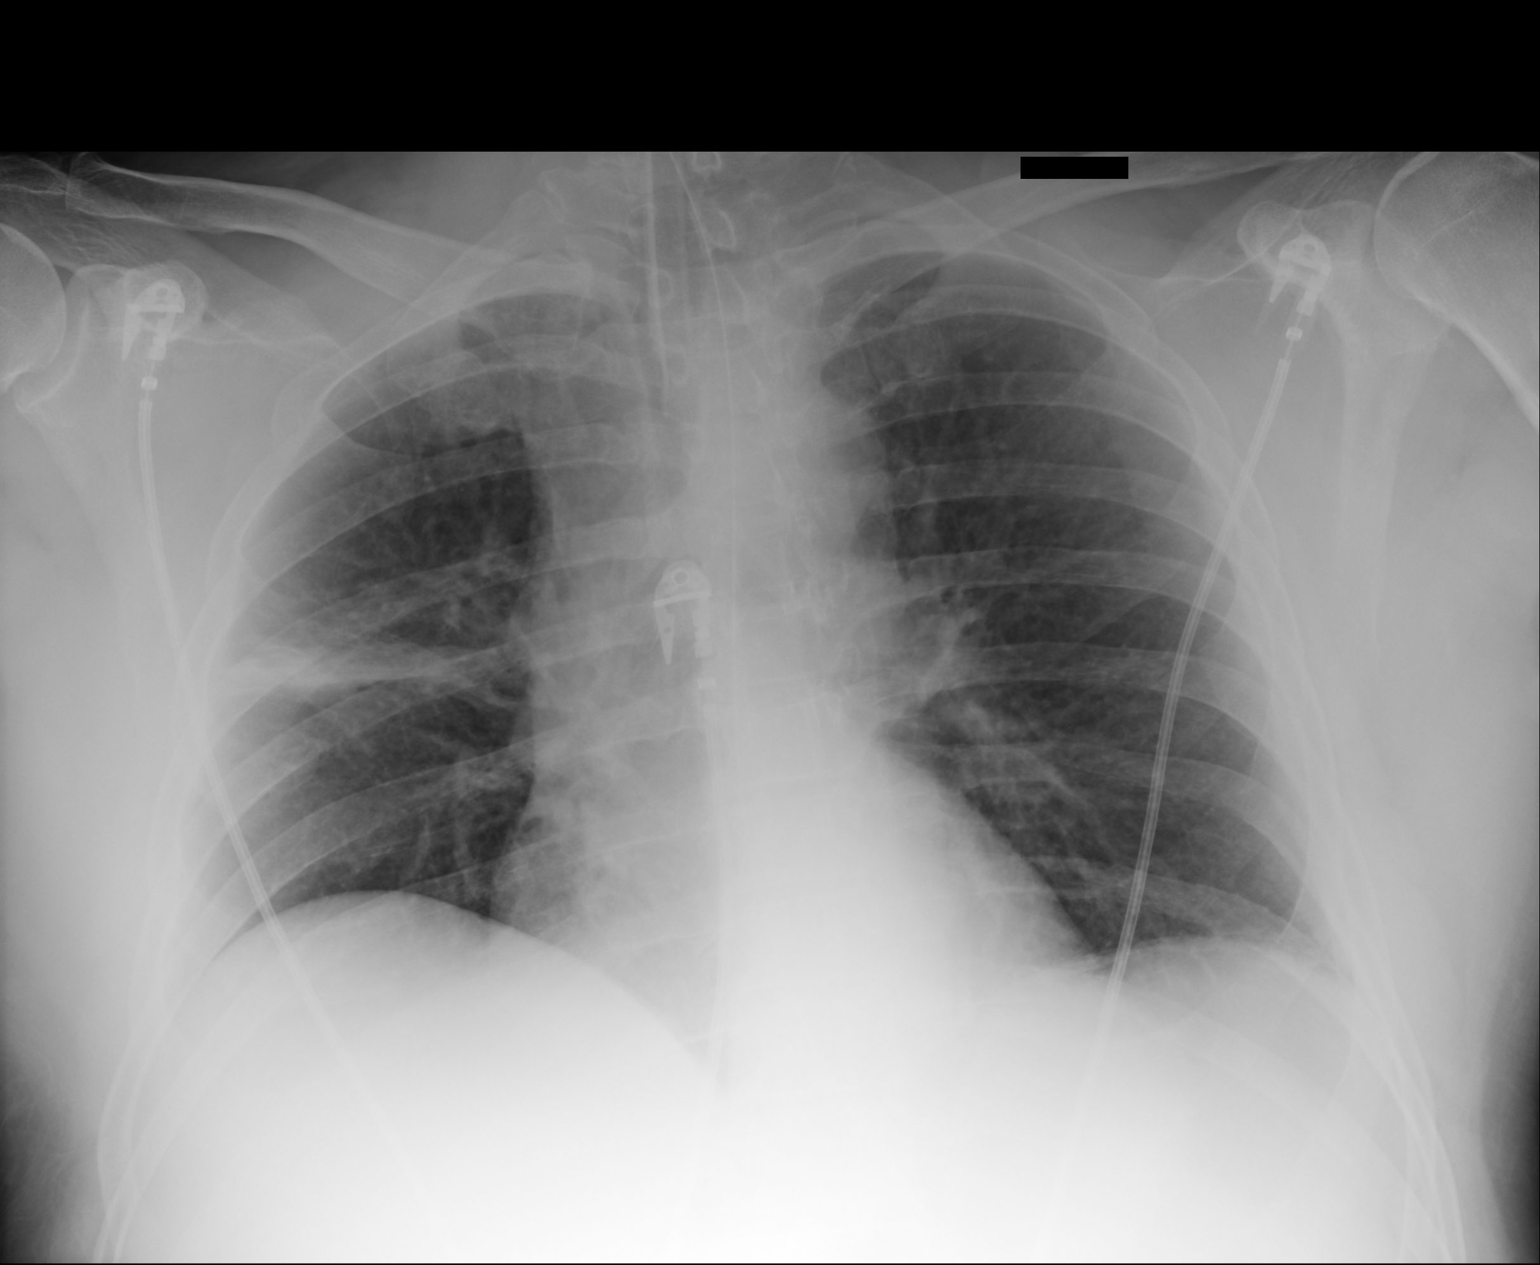

[1 of 1 positions shown; findings below may reference images not displayed]

PROCEDURE:     DXR - DXR PORTABLE CHEST SINGLE VIEW  - July 11, 2013  [DATE]

RESULT:     There is an endotracheal tube in place with the tip at the level
of the inferior margin of clavicular heads. There is atelectasis in the
right midlung versus pleural fluid in the appearing the lateral costophrenic
angles appear sharp. The cardiac silhouette is not enlarged. There is mild
prominence of the central pulmonary vascularity.
IMPRESSION: The findings are consistent with atelectasis or pneumonia
in the right midlung. There may be fluid in the minor fissure. The
endotracheal tube tip appears to be in good position. The esophagogastric
tube tip projects off the film.

[REDACTED]

## 2014-09-06 ENCOUNTER — Inpatient Hospital Stay: Payer: Self-pay | Admitting: Internal Medicine

## 2014-09-06 LAB — COMPREHENSIVE METABOLIC PANEL
ALK PHOS: 90 U/L
AST: 38 U/L — AB (ref 15–37)
Albumin: 3.6 g/dL (ref 3.4–5.0)
Anion Gap: 9 (ref 7–16)
BILIRUBIN TOTAL: 0.4 mg/dL (ref 0.2–1.0)
BUN: 21 mg/dL — AB (ref 7–18)
CHLORIDE: 102 mmol/L (ref 98–107)
Calcium, Total: 8.3 mg/dL — ABNORMAL LOW (ref 8.5–10.1)
Co2: 23 mmol/L (ref 21–32)
Creatinine: 0.84 mg/dL (ref 0.60–1.30)
EGFR (African American): >60
EGFR (Non-African Amer.): >60
Glucose: 177 mg/dL — ABNORMAL HIGH (ref 65–99)
Osmolality: 276 (ref 275–301)
Potassium: 3.6 mmol/L (ref 3.5–5.1)
SGPT (ALT): 45 U/L
Sodium: 134 mmol/L — ABNORMAL LOW (ref 136–145)
Total Protein: 7.2 g/dL (ref 6.4–8.2)

## 2014-09-06 LAB — CBC
HCT: 44 % (ref 40.0–52.0)
HGB: 15.3 g/dL (ref 13.0–18.0)
MCH: 34.1 pg — ABNORMAL HIGH (ref 26.0–34.0)
MCHC: 34.8 g/dL (ref 32.0–36.0)
MCV: 98 fL (ref 80–100)
PLATELETS: 239 10*3/uL (ref 150–440)
RBC: 4.49 10*6/uL (ref 4.40–5.90)
RDW: 13.6 % (ref 11.5–14.5)
WBC: 7.4 10*3/uL (ref 3.8–10.6)

## 2014-09-06 LAB — D-DIMER(ARMC): D-Dimer: 329 ng/ml

## 2014-09-06 LAB — TROPONIN I: Troponin-I: <0.02 ng/mL

## 2014-09-06 LAB — PRO B NATRIURETIC PEPTIDE: B-Type Natriuretic Peptide: 11 pg/mL (ref 0–125)

## 2014-11-06 ENCOUNTER — Emergency Department: Payer: Self-pay | Admitting: Emergency Medicine

## 2014-12-07 ENCOUNTER — Emergency Department: Payer: Self-pay | Admitting: Emergency Medicine

## 2014-12-07 LAB — COMPREHENSIVE METABOLIC PANEL
Albumin: 3.9 g/dL (ref 3.4–5.0)
Alkaline Phosphatase: 98 U/L
Anion Gap: 5 — ABNORMAL LOW (ref 7–16)
BUN: 16 mg/dL (ref 7–18)
Bilirubin,Total: 0.8 mg/dL (ref 0.2–1.0)
Calcium, Total: 8.8 mg/dL (ref 8.5–10.1)
Chloride: 103 mmol/L (ref 98–107)
Co2: 29 mmol/L (ref 21–32)
Creatinine: 0.96 mg/dL (ref 0.60–1.30)
EGFR (African American): >60
EGFR (Non-African Amer.): >60
Glucose: 106 mg/dL — ABNORMAL HIGH (ref 65–99)
Osmolality: 275 (ref 275–301)
Potassium: 3.9 mmol/L (ref 3.5–5.1)
SGOT(AST): 30 U/L (ref 15–37)
SGPT (ALT): 34 U/L
Sodium: 137 mmol/L (ref 136–145)
Total Protein: 8 g/dL (ref 6.4–8.2)

## 2014-12-07 LAB — CBC
HCT: 47.3 % (ref 40.0–52.0)
HGB: 15.9 g/dL (ref 13.0–18.0)
MCH: 33.2 pg (ref 26.0–34.0)
MCHC: 33.6 g/dL (ref 32.0–36.0)
MCV: 99 fL (ref 80–100)
Platelet: 250 10*3/uL (ref 150–440)
RBC: 4.79 10*6/uL (ref 4.40–5.90)
RDW: 14.2 % (ref 11.5–14.5)
WBC: 16.2 10*3/uL — ABNORMAL HIGH (ref 3.8–10.6)

## 2014-12-07 LAB — CK TOTAL AND CKMB (NOT AT ARMC): CK, TOTAL: 60 U/L (ref 39–308)

## 2014-12-07 LAB — ETHANOL

## 2014-12-07 LAB — TROPONIN I

## 2014-12-29 ENCOUNTER — Emergency Department: Payer: Self-pay | Admitting: Emergency Medicine

## 2014-12-29 LAB — CBC
HCT: 45.2 % (ref 40.0–52.0)
HGB: 15.5 g/dL (ref 13.0–18.0)
MCH: 33.5 pg (ref 26.0–34.0)
MCHC: 34.2 g/dL (ref 32.0–36.0)
MCV: 98 fL (ref 80–100)
PLATELETS: 292 10*3/uL (ref 150–440)
RBC: 4.62 10*6/uL (ref 4.40–5.90)
RDW: 14 % (ref 11.5–14.5)
WBC: 7.4 10*3/uL (ref 3.8–10.6)

## 2014-12-29 LAB — URINALYSIS, COMPLETE
BILIRUBIN, UR: NEGATIVE
Bacteria: NONE SEEN
Blood: NEGATIVE
Glucose,UR: NEGATIVE mg/dL (ref 0–75)
KETONE: NEGATIVE
Leukocyte Esterase: NEGATIVE
Nitrite: NEGATIVE
PH: 5 (ref 4.5–8.0)
Protein: NEGATIVE
Specific Gravity: 1.01 (ref 1.003–1.030)
WBC UR: NONE SEEN /HPF (ref 0–5)

## 2014-12-29 LAB — DRUG SCREEN, URINE
AMPHETAMINES, UR SCREEN: NEGATIVE (ref ?–1000)
BARBITURATES, UR SCREEN: NEGATIVE (ref ?–200)
BENZODIAZEPINE, UR SCRN: POSITIVE (ref ?–200)
Cannabinoid 50 Ng, Ur ~~LOC~~: POSITIVE (ref ?–50)
Cocaine Metabolite,Ur ~~LOC~~: POSITIVE (ref ?–300)
MDMA (Ecstasy)Ur Screen: NEGATIVE (ref ?–500)
Methadone, Ur Screen: NEGATIVE (ref ?–300)
OPIATE, UR SCREEN: NEGATIVE (ref ?–300)
PHENCYCLIDINE (PCP) UR S: NEGATIVE (ref ?–25)
TRICYCLIC, UR SCREEN: NEGATIVE (ref ?–1000)

## 2014-12-29 LAB — BASIC METABOLIC PANEL
Anion Gap: 11 (ref 7–16)
BUN: 13 mg/dL (ref 7–18)
CALCIUM: 8.4 mg/dL — AB (ref 8.5–10.1)
CHLORIDE: 101 mmol/L (ref 98–107)
Co2: 22 mmol/L (ref 21–32)
Creatinine: 1.14 mg/dL (ref 0.60–1.30)
EGFR (African American): >60
EGFR (Non-African Amer.): >60
Glucose: 100 mg/dL — ABNORMAL HIGH (ref 65–99)
Osmolality: 268 (ref 275–301)
POTASSIUM: 3.6 mmol/L (ref 3.5–5.1)
SODIUM: 134 mmol/L — AB (ref 136–145)

## 2014-12-29 LAB — SALICYLATE LEVEL: Salicylates, Serum: 15.1 mg/dL — ABNORMAL HIGH

## 2014-12-29 LAB — ACETAMINOPHEN LEVEL: Acetaminophen: <2 ug/mL

## 2014-12-29 LAB — TROPONIN I: Troponin-I: <0.02 ng/mL

## 2014-12-29 LAB — ETHANOL: Ethanol: 57 mg/dL

## 2015-01-03 ENCOUNTER — Emergency Department: Payer: Self-pay | Admitting: Emergency Medicine

## 2015-01-03 LAB — COMPREHENSIVE METABOLIC PANEL
ANION GAP: 7 (ref 7–16)
Albumin: 4 g/dL (ref 3.4–5.0)
Alkaline Phosphatase: 92 U/L
BILIRUBIN TOTAL: 0.4 mg/dL (ref 0.2–1.0)
BUN: 12 mg/dL (ref 7–18)
CO2: 23 mmol/L (ref 21–32)
Calcium, Total: 8.4 mg/dL — ABNORMAL LOW (ref 8.5–10.1)
Chloride: 106 mmol/L (ref 98–107)
Creatinine: 1.02 mg/dL (ref 0.60–1.30)
EGFR (Non-African Amer.): >60
Glucose: 82 mg/dL (ref 65–99)
Osmolality: 271 (ref 275–301)
Potassium: 3.9 mmol/L (ref 3.5–5.1)
SGOT(AST): 35 U/L (ref 15–37)
SGPT (ALT): 34 U/L
SODIUM: 136 mmol/L (ref 136–145)
TOTAL PROTEIN: 7.5 g/dL (ref 6.4–8.2)

## 2015-01-03 LAB — URINALYSIS, COMPLETE
Bacteria: NONE SEEN
Bilirubin,UR: NEGATIVE
Blood: NEGATIVE
GLUCOSE, UR: NEGATIVE mg/dL (ref 0–75)
Ketone: NEGATIVE
LEUKOCYTE ESTERASE: NEGATIVE
NITRITE: NEGATIVE
Ph: 5 (ref 4.5–8.0)
Protein: NEGATIVE
RBC,UR: 2 /HPF (ref 0–5)
Specific Gravity: 1.019 (ref 1.003–1.030)
Squamous Epithelial: 1

## 2015-01-03 LAB — CBC WITH DIFFERENTIAL/PLATELET
BASOS ABS: 0.1 10*3/uL (ref 0.0–0.1)
Basophil %: 0.8 %
EOS ABS: 0.1 10*3/uL (ref 0.0–0.7)
Eosinophil %: 1.6 %
HCT: 43.1 % (ref 40.0–52.0)
HGB: 14.6 g/dL (ref 13.0–18.0)
LYMPHS ABS: 1.3 10*3/uL (ref 1.0–3.6)
Lymphocyte %: 16 %
MCH: 33.3 pg (ref 26.0–34.0)
MCHC: 33.8 g/dL (ref 32.0–36.0)
MCV: 99 fL (ref 80–100)
MONO ABS: 0.5 x10 3/mm (ref 0.2–1.0)
Monocyte %: 6.2 %
NEUTROS ABS: 6.3 10*3/uL (ref 1.4–6.5)
Neutrophil %: 75.4 %
PLATELETS: 248 10*3/uL (ref 150–440)
RBC: 4.37 10*6/uL — ABNORMAL LOW (ref 4.40–5.90)
RDW: 13.8 % (ref 11.5–14.5)
WBC: 8.4 10*3/uL (ref 3.8–10.6)

## 2015-03-06 IMAGING — CT CT HEAD WITHOUT CONTRAST
2 series · 14 of 30 positions shown, 16 images · non-contrast
Comparison: There are no previous studies on file for comparison.

CLINICAL DATA: Syncope

EXAM:
CT HEAD WITHOUT CONTRAST
TECHNIQUE: Contiguous axial images were obtained from the base of the skull
through the vertex without intravenous contrast.

[Series 2: head wo · axial · 0.39mm/px · z∈[+363,+468]mm · 6 of 31 slices shown, 8 images]
[im 5/31  brain]
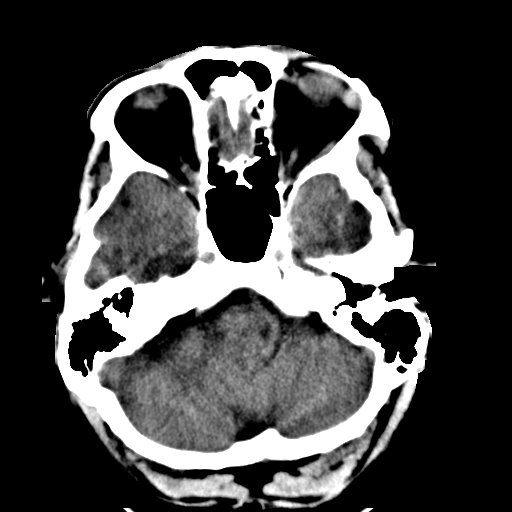
[im 5/31  bone]
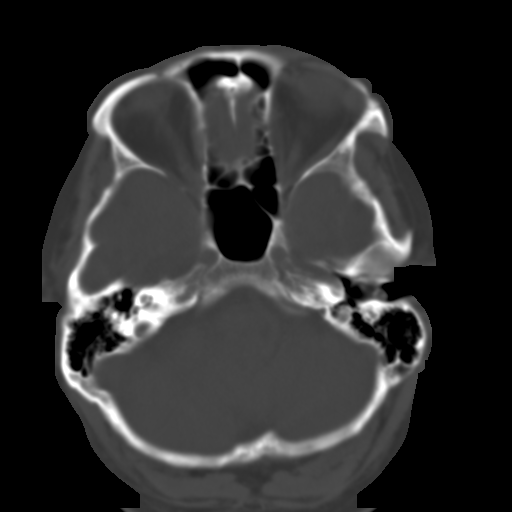
[im 9/31  brain]
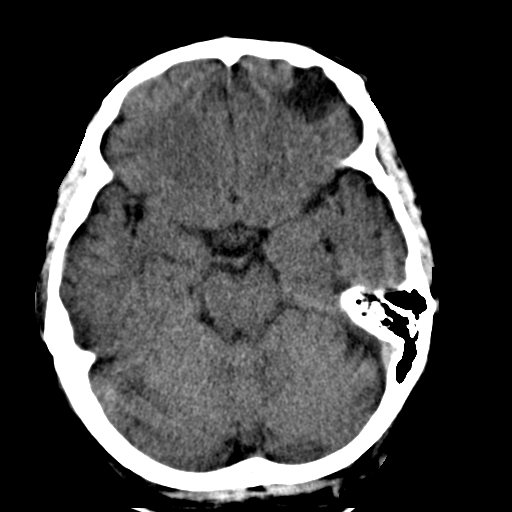
[im 13/31  brain]
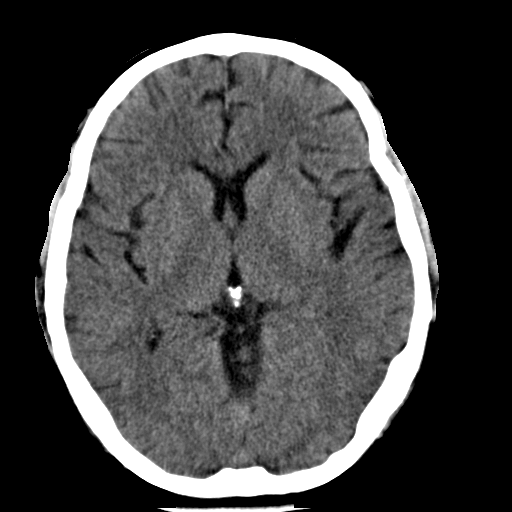
[im 18/31  brain]
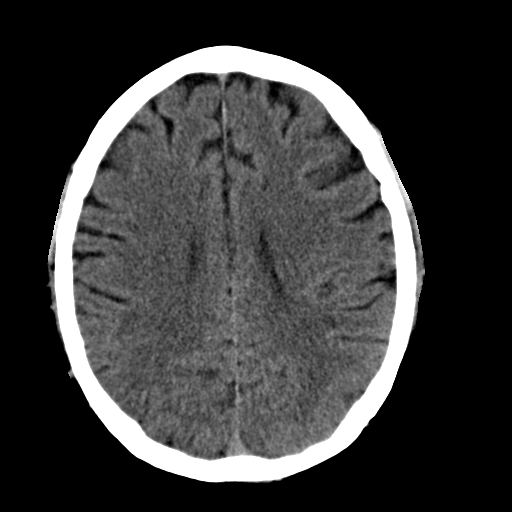
[im 22/31  brain]
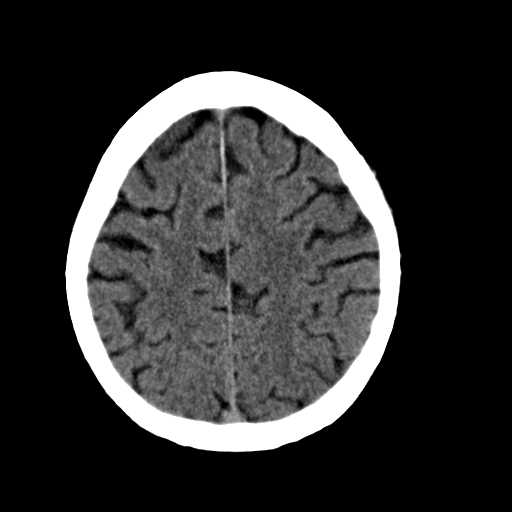
[im 22/31  bone]
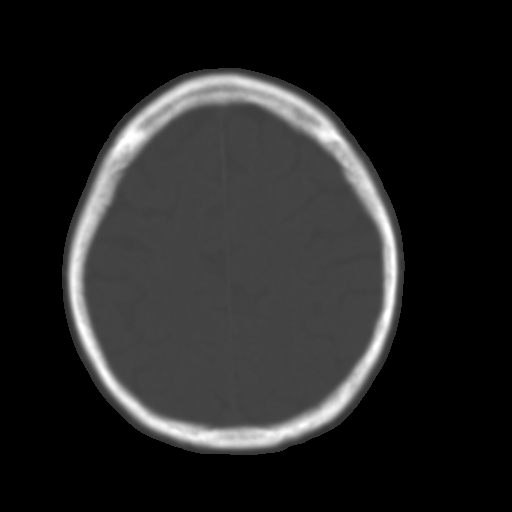
[im 26/31  brain]
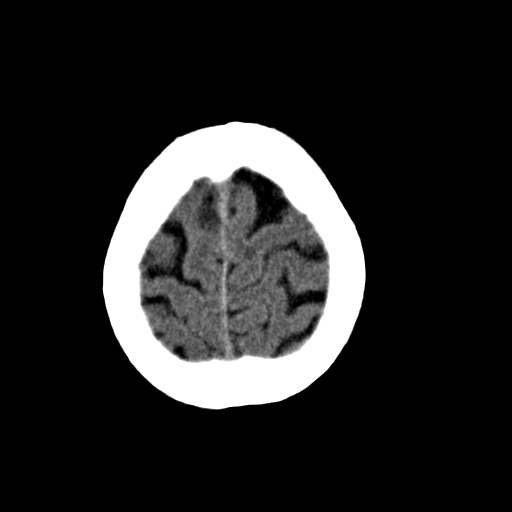

[Series 3: head bone · axial · 0.39mm/px · z∈[+350,+482]mm · 8 of 82 slices shown]
[im 8/82  bone]
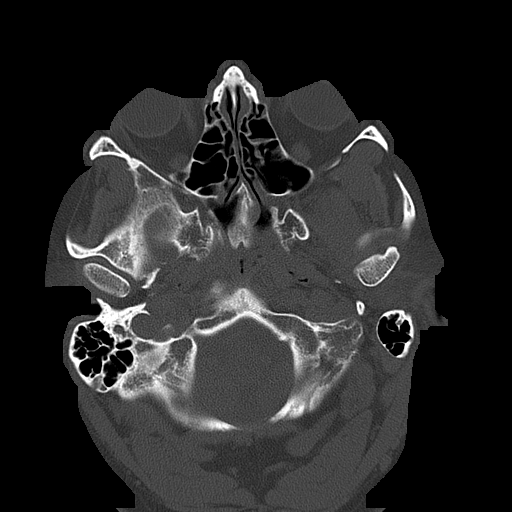
[im 16/82  bone]
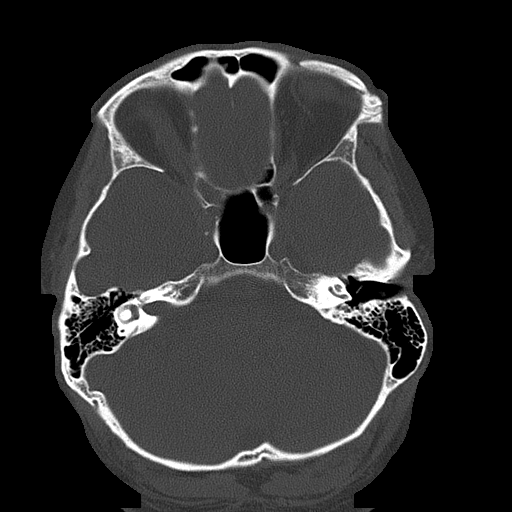
[im 28/82  bone]
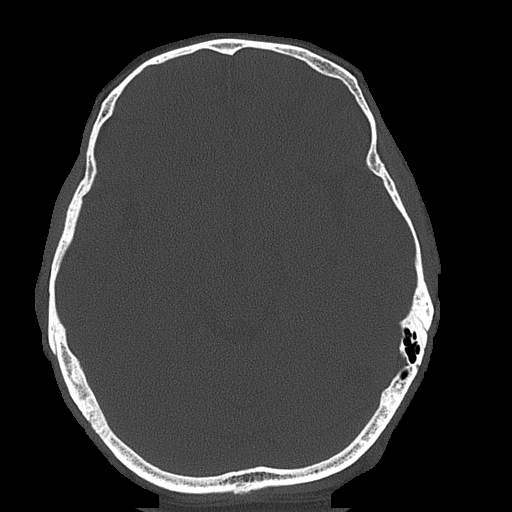
[im 35/82  bone]
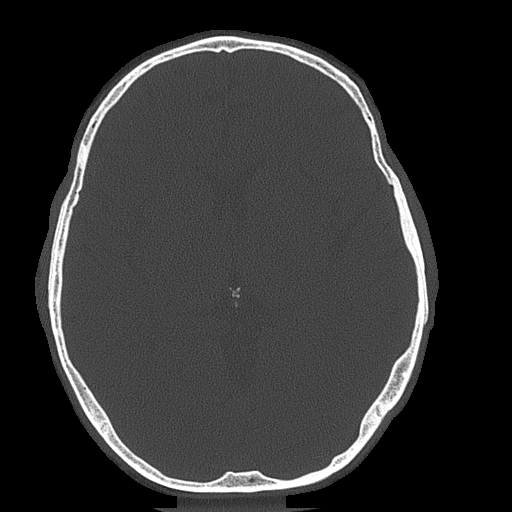
[im 47/82  bone]
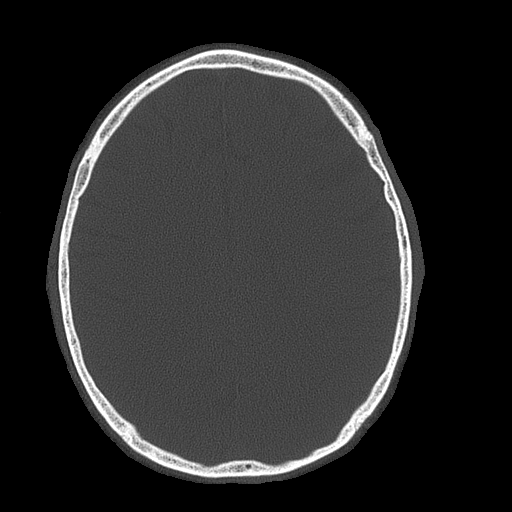
[im 55/82  bone]
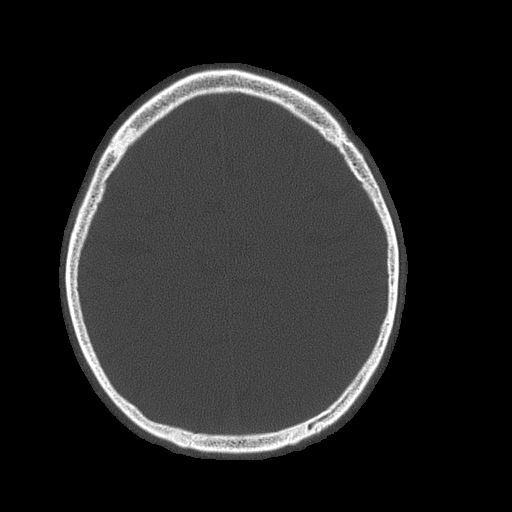
[im 66/82  bone]
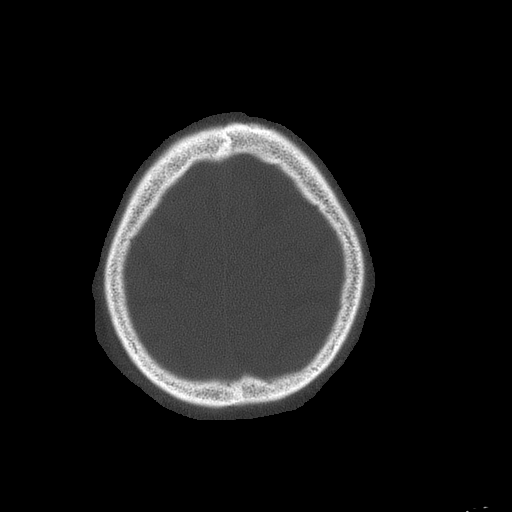
[im 74/82  bone]
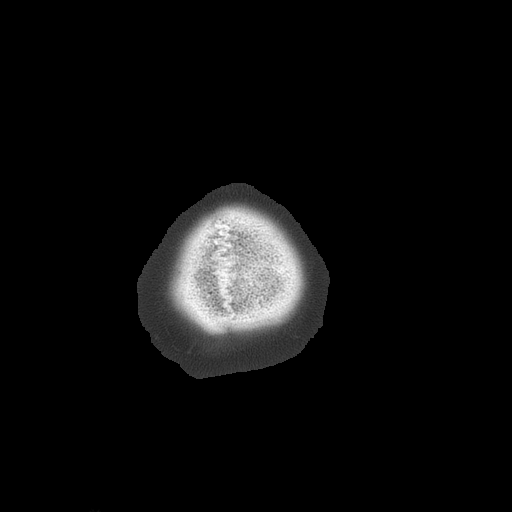

[14 of 30 positions shown; findings below may reference images not displayed]

FINDINGS: The ventricles are normal in size and position. There is an area of
encephalomalacia in the inferior aspect of the left frontal lobe
which measures approximately 2.1 cm in diameter. There is no
evidence of an acute intracranial hemorrhage nor of an evolving
ischemic infarction. There are no abnormal intracranial
calcifications. The cerebellum and brainstem are normal in density.

At bone window settings there is a comminuted fracture of the nasal
bone that is of uncertain age. No significant soft tissue swelling
is demonstrated. The observed portions of the paranasal sinuses and
mastoid air cells are clear. There is no evidence of an acute skull
fracture.
IMPRESSION: 1. There is no evidence of an acute ischemic or hemorrhagic
infarction or other intracranial hemorrhagic event.
2. There is an area of encephalomalacia inferiorly in the left
frontal lobe.
3. There is a comminuted nasal bone fracture that is of uncertain
age. There is no evidence of an acute skull fracture.

## 2015-04-13 NOTE — Consult Note (Signed)
PATIENT NAME:  Matthew Braun, Matthew Braun MR#:  [id number] DATE OF BIRTH:  28-Sep-1961  DATE OF CONSULTATION:  07/21/2013  REFERRING PHYSICIAN:  Enedina FinnerSona Patel, MD CONSULTING PHYSICIAN:  Ardeen FillersUzma S. Garnetta BuddyFaheem, MD  REASON FOR CONSULTATION: Overdosed on Klonopin and gabapentin.   HISTORY OF PRESENT ILLNESS: The patient is a 54 year old male who actually overdosed on high doses of Gabapentin 120 pills of 300 mg each and Klonopin 30 pills which were 1 mg each.  He was intoxicated at that time, he was found by his wife and EMS gave him charcoal and he was initially admitted to the CCU. The patient remained intubated in the CCU for approximately 9 days. I attempted to see the patient earlier, but he was not able to participate in the interview. Since he has been extubated now and slightly awake, the patient was interviewed in the presence of the sitter as well as the Child psychotherapistsocial worker. He reported that he was having a bad day and was having some arguments with his wife and was drinking at that time. He gave some sketchy details about the day when he tried to overdose. He reported that he felt bad and he took 30 pills of the Klonopin. He reported that he was trying to just get off of everything. He stated that then he fell and was brought to the hospital. The patient reported that he is now feeling better. He stated that he is also having chronic pain in his legs and in his back. He wants some medication to help with his chronic pain. He stated that he follows [facility], who has been prescribing his medication and was looking for some pain medications. He reported that he is unable to walk at this time due to deconditioning of his muscles since he has been lying in the bed for the past 9 days. The patient reported that he drank steadily before being admitted to the hospital and consumes 40 ounces, 2 to 4 cans, on a daily basis. He reported that his wife drinks with him. He appeared somewhat depressed and anxious during the  interview.   PAST PSYCHIATRIC HISTORY: The patient reported that he has been diagnosed with depression and was prescribed medication [facility]. He denied previous psychiatric admission.   MEDICAL HISTORY: Hypertension, chronic back pain, likely PVD versus neuropathy.   CURRENT MEDICATIONS: At the time of admission: 1.  Gabapentin 300 mg 4 times daily. 2.  Klonopin 1 mg 4 times daily. 3.  Hydrochlorothiazide 25 mg daily.   SOCIAL HISTORY: The patient works as an Journalist, newspaperauto mechanic. He stated that he last worked in April. He also smokes a pack of cigarettes per day. He stated that he has been married for 12 years and lives with his wife. He has 2 children. He stated that his son overdosed on heroin and passed away last year. He stated that his wife was unable to handle that and [facility] was [facility]. He reported that he is trying to handle the death of his son at this time. The patient appeared sad when he was talking about the death of his son due to overdose on heroin.   MENTAL STATUS EXAMINATION: The patient is a moderately obese male who was sitting in the CCU bed. He appeared somewhat disheveled. His speech was slurred. Mood was anxious. Affect was congruent. Thought process was tangential. Thought content was nondelusional. He was unable to provide much detail about the reasons for his overdose.  He was trying to minimize the event. He was intubated and now he is currently out and able to communicate during the interview.   DIAGNOSTIC IMPRESSION: AXIS I: 1.  Delirium.  2.  Major depressive disorder, severe without psychotic features.  3.  Alcohol dependence.   TREATMENT PLAN: The patient will be placed again on IVC papers as his IV papers which ere placed initially have expired.  Since he is not medically stable, he will be monitored in the CCU and once he gets medically stable, he can be transferred to the behavioral health unit depending upon the availability of the  beds. Please call the behavioral health unit and determine about availability of the beds downstairs.  The patient is currently getting Xanax 1 mg b.i.d. for withdrawals from the medications.   Thank you for allowing me to participate in the care of this patient.  ____________________________ Ardeen Fillers. Garnetta Buddy, MD usf:sb D: 07/21/2013 15:23:43 ET T: 07/21/2013 16:54:03 ET JOB#: [id number]  cc: Ardeen Fillers. Garnetta Buddy, MD, <Dictator> Rhunette Croft MD ELECTRONICALLY SIGNED 07/26/2013 12:37

## 2015-04-13 NOTE — Discharge Summary (Signed)
PATIENT NAME:  Matthew Braun, Matthew Braun MR#:  [id number] DATE OF BIRTH:  1961-07-12  DATE OF ADMISSION:  07/22/2013 DATE OF DISCHARGE:  07/25/2013  HOSPITAL COURSE: See dictated history and physical for details of admission. A 54 year old man with a history of alcohol abuse and benzodiazepine abuse, admitted as a transfer from the medical service. He had been in the CCU for several days after a near-fatal overdose on a combination of clonazepam and Neurontin along with heavy drinking. The patient was brought to the behavioral health ward voluntarily. He did not engage in any dangerous or suicidal behavior. He denied any active suicidal ideation. At first, his affect was withdrawn and dysphoric, but he became more interactive and upbeat. After a day of resting, he seemed to regain his energy and cleaned himself up and stated he was much more optimistic and feeling good. He had some insight into his substance use problem. He did not complain about the discontinuation of his Xanax.   The patient was educated in group and individual therapy about the importance of staying sober. He did not have an interest in going to the alcohol and drug abuse treatment center. He will be following up with [facility] where he already sees a counselor.   DISCHARGE MEDICATIONS: His gabapentin, trazodone, amlodipine, and steroid cream with just one last day of prednisone taper.   Gabapentin 300 mg four times a day, prednisone 10 mg once a day tomorrow and then stop, trazodone 100 mg at night as needed for sleep, amlodipine 5 mg once a day for blood pressure, triamcinolone topical cream to his rash twice a day.   DISCHARGE DISPOSITION: The patient was [facility] with [facility]. He will be staying at home.   LABORATORY RESULTS: Potassium and phosphorus and magnesium all normalized during his time here. Glucose was slightly elevated but was rechecked today and was 102.   MENTAL STATUS EXAMINATION: Neatly dressed and groomed man who looks  his stated age, cooperative with the interview. Good eye contact. Normal psychomotor activity. Speech normal rate, tone and volume. Affect euthymic, reactive, appropriate. Mood stated as good. Thoughts are lucid without loosening of associations or delusions. Denies auditory or visual hallucinations. Denies suicidal or homicidal ideation. Shows improved insight and judgment and normal intelligence.   DIAGNOSIS, PRINCIPAL AND PRIMARY:  AXIS I: Alcohol dependence.   SECONDARY DIAGNOSES:  AXIS I: Substance-induced mood disorder, benzodiazepine abuse.  AXIS II: Deferred.  AXIS III: Recovery from his intubation, chronic anxiety.  AXIS IV: Severe from his ongoing medical and psychosocial problems and decreased access to care.  AXIS V: Functioning at time of discharge 56.     ____________________________ Audery AmelJohn T. Raphaela Cannaday, MD jtc:np D: 07/25/2013 17:35:00 ET T: 07/25/2013 22:37:51 ET JOB#: [id number]  cc: Audery AmelJohn T. Lacorey Brusca, MD, <Dictator> Audery AmelJOHN T Ammanda Dobbins MD ELECTRONICALLY SIGNED 07/27/2013 10:36

## 2015-04-13 NOTE — Consult Note (Signed)
Brief Consult Note: Comments: Pt extubated but still very sedated and still under the influence of Precedex and Ativan and unable to participate in Psychiatric Eval. Please reconsult in am.  Electronic Signatures: Rhunette CroftFaheem, Tyaire Odem S (MD)  (Signed 29-Jul-14 15:40)  Authored: Brief Consult Note   Last Updated: 29-Jul-14 15:40 by Rhunette CroftFaheem, Pansy Ostrovsky S (MD)

## 2015-04-13 NOTE — Consult Note (Signed)
Details:   - Psychiatry: PAtient seen for follow up. Patient with multidrug and alcohol od still needing detox and stabilization. He is willing to go but Dr Garnetta BuddyFaheem petitioned him yesterday anyway. [facility] as we have bed availibility which should be later today. Psych intake nurse aware and will notify unit when he should be brought down.   Electronic Signatures: Audery Amellapacs, Afshin Chrystal T (MD)  (Signed 01-Aug-14 13:29)  Authored: Details   Last Updated: 01-Aug-14 13:29 by Audery Amellapacs, Geralynn Capri T (MD)

## 2015-04-13 NOTE — Discharge Summary (Signed)
PATIENT NAME:  Matthew Braun, Matthew Braun MR#:  [id number] DATE OF BIRTH:  May 31, 1961  DATE OF ADMISSION:  07/11/2013 DATE OF DISCHARGE:  07/22/2013  The patient was discharged in 07/22/2013 to Behavioral Medicine.   For detailed note, please take a look at history and physical done on admission by Dr. Enedina Finner. Please also look at the interim discharge summary done by Dr. Alford Highland, which covers hospital course from date of admission, July 21st until 07/19/2013. This is just an just an interim hospital course from July 30th through August 1st, the day of discharge.   DISCHARGE DIAGNOSES: As follows:   1.  Drug overdose/suicide attempt.  2.  Depression.  3.  Acute respiratory failure secondary to chronic obstructive pulmonary disease exacerbation.  4.  Chronic obstructive pulmonary disease exacerbation.  5.  Anxiety.  6.  Hypertension.  7.  Hypokalemia.  8.  Tobacco abuse.   DIET: The patient is being discharged on a regular diet.   ACTIVITY: As tolerated.   DISPOSITION:  The patient is being transferred to [facility] Medicine as mentioned.   DISCHARGE MEDICATIONS: Gabapentin 300 mg q.i.d., prednisone taper starting at 50 mg down to 10 mg over the next 5 days, Klonopin 1 mg t.i.d., amlodipine 5 mg daily, triamcinolone topical cream to be applied b.i.d., nicotine transdermal patch daily.   CONSULTANTS DURING THE HOSPITAL COURSE: Dr. Belia Heman from pulmonary critical care.  Dr. Mordecai Rasmussen from psychiatry.   BRIEF INTERIM HOSPITAL COURSE: As follows: This is a 54 year old male with multiple medical problems as mentioned above, presented to the hospital on 07/11/2013, secondary to a suicide attempt and a drug overdose.   1.  Drug overdose and suicide attempt. As mentioned earlier, the patient took increasing amounts of Neurontin, Klonopin and had heavy alcohol abuse. When the patient was admitted to the hospital, he was intubated due to airway protection. After the patient was extubated, a  psychiatric consult was obtained. The patient was seen by Dr. Toni Amend from psychiatry and thought it would be appropriate to admit the patient to Behavioral Medicine for further psychotherapy for his depression and suicide attempt. The patient, while on the medical service, had a one-to-one sitter and was under involuntarily commitment throughout the entire hospital course.  2.  Acute respiratory failure. The patient's respiratory failure was secondary to COPD exacerbation, as he has a history of heavy tobacco abuse. The patient was apparently intubated on admission and was extubated a few days after, but had to be reintubated. The likely cause of the patient's respiratory failure, as mentioned, was COPD exacerbation. The patient was treated aggressively with IV Solu-Medrol, broad-spectrum IV antibiotics and some DuoNebs. A pulmonary consult was also obtained, and the patient was seen by Dr. Belia Heman throughout the hospital course. The patient eventually, the second time when was extubated, was weaned off to just 2 to 3 liters nasal cannula and eventually to room air and did well. He was weaned off his steroids to just a prednisone taper, as mentioned, and was strongly advised to quit smoking. The patient was maintained on some DuoNebs as needed, which he will continue when he gets discharged to [facility]. The patient did not have any evidence of focal pneumonia, therefore, was weaned off antibiotics.  3.  Heavy alcohol abuse and alcohol withdrawal. The patient went through alcohol withdrawal while on the medical service. Initially was just maintained on CIWA protocol and also on a Precedex drip, both of which were eventually tapered, and the patient currently is alert, awake,  oriented, and he is being discharged to Behavioral Medicine for further detox from his alcohol abuse.  4.  Hypertension. The patient was maintained on some p.r.n. hydralazine and some oral hydrochlorothiazide, but became hypokalemic  with hydrochlorothiazide; therefore, I switched him to some low-dose oral Norvasc, which he is currently going to take for his hypertension.  5.  Hypokalemia. The patient's potassium was supplemented and since then has improved and resolved.  6.  Rash. The patient has a chronic rash secondary to psoriasis on both his lower extremities and on his dependent areas in the back and his buttocks. He will continue his triamcinolone ointment as mentioned.   The patient is presently hemodynamically stable, alert, awake, oriented, is ambulating well. Therefore, he is being discharged to Behavioral Medicine for further psychotherapy and psychiatric help. The patient is in agreement with this plan.   TIME SPENT ON DISCHARGE: 40 minutes.   ____________________________ Rolly PancakeVivek J. Cherlynn KaiserSainani, MD vjs:dmm D: 07/28/2013 12:45:20 ET T: 07/28/2013 13:22:02 ET JOB#: [id number]  cc: Rolly PancakeVivek J. Cherlynn KaiserSainani, MD, <Dictator> Houston SirenVIVEK J SAINANI MD ELECTRONICALLY SIGNED 08/02/2013 12:34

## 2015-04-13 NOTE — Op Note (Signed)
PATIENT NAME:  Matthew Braun, Matthew Braun MR#:  [id number] DATE OF BIRTH:  [birth year]  DATE OF PROCEDURE:  07/14/2013  PREOPERATIVE DIAGNOSES:   1. Respiratory failure.  2. Drug overdose.  3. Depression.  4. Hypertension.   POSTOPERATIVE DIAGNOSES:  1. Respiratory failure.  2. Drug overdose.  3. Depression.  4. Hypertension.   PROCEDURES:  1. Ultrasound guidance for vascular access to left brachial vein.  2. Fluoroscopic guidance for placement of catheter.  3. Insertion of peripherally inserted central venous catheter, triple lumen, left arm.  SURGEON: Annice NeedyJason S. Dew, M.D.   ANESTHESIA: Local.   ESTIMATED BLOOD LOSS: Minimal.   INDICATION FOR PROCEDURE: A 54 year old male, who had a suicide attempt, who has respiratory failure and multiple ongoing issues requiring durable venous access. We were asked to place a PICC line.   DESCRIPTION OF PROCEDURE: The patient's left arm was sterilely prepped and draped, and a sterile surgical field was created. The left brachial vein was accessed under direct ultrasound guidance without difficulty with a micropuncture needle and permanent image was recorded. 0.018 wire was then placed into the superior vena cava. Peel-away sheath was placed over the wire. A single lumen peripherally inserted central venous catheter was then placed over the wire and the wire and peel-away sheath were removed. The catheter tip was placed into the superior vena cava and was secured at the skin at 42 cm with a sterile dressing. The catheter withdrew blood well and flushed easily with heparinized saline. The patient tolerated procedure well.  ____________________________ Annice NeedyJason S. Dew, MD jsd:gb D: 07/14/2013 16:16:01 ET T: 07/14/2013 21:05:33 ET JOB#: [id number]  cc: Annice NeedyJason S. Dew, MD, <Dictator> Annice NeedyJASON S DEW MD ELECTRONICALLY SIGNED 07/18/2013 12:29

## 2015-04-13 NOTE — H&P (Signed)
PATIENT NAME:  Matthew FeilBARBOUR, Matthew Braun MR#:  [id number] DATE OF BIRTH:  02/21/1961  DATE OF ADMISSION:  07/11/2013  PRIMARY CARE PHYSICIAN: None.   [place]CHIATRI[place]   CHIEF COMPLAINT: Altered mental status, lethargy. The patient overdosed on Klonopin and gabapentin.   HISTORY OF PRESENT ILLNESS: History is obtained from the patient's wife and ER physician. Matthew Braun is a 54 year old, morbidly obese Caucasian gentleman with history of depression, hypertension and history of chronic leg pain, likely due to PVD versus neuropathy, comes in to the Emergency Room after he was found lethargic at home. The patient per wife had been drinking alcohol, "was having a good day," took his gabapentin 120 pills of 300 mg and Klonopin 30 pills, 1 mg each, as part of overdose intentional along with alcohol. He was found.  EMS gave charcoal. The patient was awake at that time, although lethargic. However, once he came to the Emergency Room he became very lethargic, obtunded, had vomited and likely has aspirated. He was intubated for airway protection and currently on the vent. The patient has been on Versed. However, he is trying to fight and pull out the ET tube; hence, he is being switched to IV propofol. The patient is being admitted for further evaluation and management on his overdose.   PAST MEDICAL HISTORY: 1.  Hypertension.  2.  Depression, anxiety.  3.  Chronic leg pain, likely PVD versus neuropathy.   ALLERGIES: No known drug allergies.   MEDICATIONS: 1.  Gabapentin 300 mg 4 times a day.  2.  Klonopin 1 mg 4 times a day.  3.  Hydrochlorothiazide 25 mg p.o. daily.   SOCIAL HISTORY: Works as a Curatormechanic. He smokes more than a pack a day. Drinks alcohol on a daily basis, and on weekends he drinks a case of beer. This information was provided by the patient's wife. The patient's wife denies any IV drug use or any other drug use.   FAMILY HISTORY: Unobtainable.   REVIEW  OF SYSTEMS: Unobtainable. The patient is intubated currently and on the vent.   PHYSICAL EXAMINATION: GENERAL: The patient is morbidly obese. He is intubated on the vent.  VITAL SIGNS: Temperature 98.6. Pulse is 84. Blood pressure is 114/72. Repeat blood pressure is 155/100. His sats are 100% on current vent setting. Pulse is 76.  HEENT: Pupils: PERRLA. EOM intact. Oral mucosa is moist. The patient has an NG tube with black charcoal return.  NECK: Supple. No JVD. No carotid bruit.  LUNGS: Clear to auscultation bilaterally. Decreased breath sounds at the bases.  CARDIOVASCULAR: Both the heart sounds are normal. Rate, rhythm regular. PMI not lateralized.  EXTREMITIES: Good pedal pulses, good femoral pulses. No lower extremity edema.  ABDOMEN: Obese, soft, nontender. No organomegaly.  NEUROLOGIC: Unable to do exam. The patient is intubated on the vent. SKIN: The patient does have some ankle rash, which appears to be eczematous. He does have some patchy hyperpigmented patches around on both lower extremities.   LABORATORY AND DIAGNOSTIC DATA: Urinalysis is negative for UTI. Urine drug screen positive for cannabinoids. His pH is 7.23; pCO2 is 57; pO2 is 150. Serum alcohol is 0.137. CBC within normal limits. Comprehensive metabolic panel within normal limits. Serum salicylate is 12.8 and plasma ammonia is 41. EKG shows normal sinus rhythm.   ASSESSMENT: A 54 year old Matthew Braun with history of hypertension and depression, anxiety, comes in with:  1.  Acute encephalopathy/altered mental status due to overdose with Klonopin 1 mg, about 30  pills, and Neurontin 300 mg, about 120 pills. He is intubated, placed on a vent for airway protection. Will place patient in the Intensive Care Unit, nothing by mouth, give the patient intravenous and start him on intravenous multivitamin for [facility] Withdrawal Assessment protocol. Psychiatry consultation has been placed. The patient can be seen after he is  extubated. Per protocol, will have sitter in the room.  2.  Possible aspiration pneumonia with acute respiratory failure due to drug overdose. The patient vomited a significant amount of charcoal. He became significantly unresponsive thereafter. For airway protection, he has been intubated. Will give him empirically intravenous Zosyn. I spoke with Dr. Belia Heman who will help manage ventilator.  3.  Acute alcohol intoxication: Will put him on [facility] Withdrawal Assessment protocol. 4.  Polysubstance abuse: Urine drug screen positive for cannabinoids.  5.  Chronic leg pain per wife, likely neuropathy versus peripheral vascular disease: On Neurontin.  6.  History of depression/anxiety: On Klonopin.  7.  Deep vein thrombosis prophylaxis with subcutaneous heparin.  8.  Further work-up according to patient's clinical course. Hospital admission plan was discussed with the patient's wife, Matthew Braun. The patient is critically ill. This was explained to the patient's wife who voiced understanding.   CRITICAL TIME SPENT: 60 minutes.    ____________________________ Wylie Hail Allena Katz, MD sap:jm D: 07/11/2013 17:52:52 ET T: 07/11/2013 18:24:42 ET JOB#: [id number]  cc: Matthew Stanke A. Allena Katz, MD, <Dictator> Dr. Elease Braun Willow Ora MD ELECTRONICALLY SIGNED 07/11/2013 19:13

## 2015-04-13 NOTE — H&P (Signed)
PATIENT NAME:  Matthew Braun, Matthew Braun MR#:  [id number] DATE OF BIRTH:  1961-06-22  DATE OF ADMISSION:  07/22/2013  DATE OF ASSESSMENT: 07/22/2013   IDENTIFYING INFORMATION AND CHIEF COMPLAINT: A 54 year old man with a history of alcohol dependence who is being transferred from the medical service after recovering from an overdose.   CHIEF COMPLAINT: "I know I need to go downstairs."   HISTORY OF PRESENT ILLNESS: Information obtained from the patient and the chart. The patient was brought into the hospital on July 21 after being found lethargic and obtunded by his wife. The history at that time was that he had been drinking and had then taken a large number of  gabapentin and clonazepam, probably as much as 120 gabapentins and 30 clonazepams.   The patient tells me today that he cannot remember doing it. He is not sure whether he was wanting to kill himself or not. He does feel like, at least for the last few days, he has been feeling depressed. He regrets the amount that he has been drinking and feels like he needs to stop using drugs as well. He has been drinking heavily at home on a daily basis. He cannot really remember what his mood was like prior to coming into the hospital. He reports no problems specifically with sleep or appetite. He is currently not reporting suicidal ideation. Denies homicidal ideation. He does say that he feels remorseful and upset and would like to stop using alcohol and drugs.  He has been taking clonazepam and Neurontin prescribed by Dr. Marguerite Olea, his outpatient psychiatrist. The patient has been treated for depression by Dr. Marguerite Olea at [facility]. He admitted that he had had one prior hospitalization for alcohol abuse several years ago in 2009. He says his longest period of sobriety was about a year from 2009 to 2010. He denies any past history of suicidal behavior. Denies any past history of violence. Denies any psychotic history. He is unclear if he had been prescribed any  antidepressant medicine but he had been taking clonazepam 1 mg four times a day and gabapentin 300 mg four times a day.   PAST MEDICAL HISTORY: Morbidly obese. History of hypertension. Chronic leg weakness and pain, probably due to vascular disease and deconditioning.   SOCIAL HISTORY: The patient continues to work as an Journalist, newspaper. He is married and lives with his wife. He had an adult child who died last year from heroin overdose. This was overwhelming for him and his wife, and his wife had to be hospitalized for some period of time.   SUBSTANCE ABUSE HISTORY: Long history of alcohol use. Also abuse of prescription medicines. No history known of seizures or delirium tremens.   CURRENT MEDICATIONS: At the time of evaluation and transfer, he is taking Xanax 1 mg q.8 hours, amlodipine 5 mg a day, gabapentin 300 mg four times a day, and a prednisone taper as well as p.r.n. medicine for detox.   ALLERGIES: No known drug allergies.   MENTAL STATUS EXAMINATION: Overweight gentleman interviewed in his hospital room. Cooperative with the interview. Good eye contact, normal psychomotor activity. Speech: Normal rate, tone and volume. Affect sad, dysphoric and anxious. Mood stated as bad. Thoughts appear lucid. No loosening of associations or evidence of delusions. Denies auditory or visual hallucinations. Denies suicidal or homicidal ideation. Reasonable judgment and insight. Alert and oriented. Normal intelligence.   PHYSICAL EXAMINATION:  GENERAL: Overweight gentleman who has difficulty moving because of his girth and deconditioning. 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 No acute skin lesions.  HEENT: Pupils equal and reactive. Face symmetric.  NEUROLOGIC: Normal strength to gross examination throughout and full range of motion at all extremities. Normal reflexes throughout. Cranial nerves symmetric and normal. The patient was very dizzy trying to stand up. Required assistance to get up, but once he got  up, he was able to move around unaided. Somewhat wide-based gait.  LUNGS: Clear with no wheezes.  HEART: Regular rate and rhythm.  ABDOMEN: Soft, nontender. Normal bowel sounds.  VITAL SIGNS: Temperature 98.2, pulse 103, respirations 20, blood pressure 136/92.   LABORATORY RESULTS: Most recent labs were a potassium 3.7, phosphorus 3.7 magnesium 2.3, all within normal limits. He had been continuing to run glucoses moderately elevated at 124. Creatinine was in the normal range at 0.87. Potassium as of two days ago had still been low at 2.9. CBC done several days ago showed anemia with a hemoglobin of 12.9, otherwise unremarkable.   ASSESSMENT: A 54 year old man with alcohol and benzodiazepine dependence, accidental overdose with near-fatal outcome. Still sad, remorseful and having some withdrawal symptoms from drugs and still on benzodiazepines. Needs further stabilization.   TREATMENT PLAN: Transferred to psychiatry. Continue detoxing on his benzodiazepines. Monitor vital signs. Monitor gait. Nursing staff can monitor him for falls risk. Try to engage him in groups and activities on the unit particularly focused on substance abuse treatment. Encourage him to consider going for longer-term substance abuse rehab.   DIAGNOSIS, PRINCIPAL AND PRIMARY:  AXIS I: Alcohol dependence.   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  AXIS II: Deferred.  AXIS III: Obesity, hypertension, recovery from overdose with withdrawal from alcohol and benzodiazepines.  AXIS IV: Severe from death of recent son and ongoing health and mental health problems.  AXIS V: Functioning at time of evaluation 30.    ____________________________ Matthew AmelJohn T. Kee Drudge, MD jtc:np D: 07/22/2013 17:51:44 ET T: 07/22/2013 19:12:18 ET JOB#: [id number]  cc: Matthew AmelJohn T. Tashika Goodin, MD, <Dictator> Matthew AmelJOHN T Tyberius Ryner MD ELECTRONICALLY SIGNED 07/24/2013 18:32

## 2015-04-14 NOTE — H&P (Signed)
PATIENT NAME:  Matthew Braun, Dylen C MR#:  [id number] DATE OF BIRTH:  01-Jun-1961  DATE OF ADMISSION:  07/04/2014  Addendum   The patient had ABG done which did show acute hypercarbic respiratory failure with pH of 7.24, pCO2 of 69. The patient became more lethargic, more obtunded, so the patient was intubated for airway protection and management of his hypercarbic respiratory failure. The patient was successfully intubated by ED physician. Will check chest x-ray. Will repeat ABG and will consult pulmonary service.   TOTAL TIME SPENT ON CRITICAL CARE AND PATIENT ADMISSION: 55 minutes.   ____________________________ Starleen Armsawood S. Bibi Economos, MD dse:lb D: 07/04/2014 06:07:50 ET T: 07/04/2014 06:13:08 ET JOB#: [id number]  cc: Starleen Armsawood S. Anayla Giannetti, MD, <Dictator> Naliah Eddington Teena IraniS Tariana Moldovan MD ELECTRONICALLY SIGNED 07/05/2014 1:46

## 2015-04-14 NOTE — H&P (Signed)
PATIENT NAME:  Matthew Braun, Tajee C MR#:  [id number] DATE OF BIRTH:  Apr 26, 1961  DATE OF ADMISSION:  07/04/2014  REFERRING PHYSICIAN: Dr. Governor Rooksebecca Lord.  PRIMARY CARE PHYSICIAN: None.   PSYCHIATRY: Dr. [place].   CHIEF COMPLAINT: Altered mental status, lethargy. The patient overdosed on Klonopin, gabapentin, and Xanax.   HISTORY OF PRESENT ILLNESS: History was obtained from ED staff and physician. This is a 54 year old male with a known history of depression, hypertension, history of chronic leg pain secondary to PVD versus neuropathy. The patient was brought to ED due to acute encephalopathy and lethargy as patient took his wife's medication. His wife had her medication refilled yesterday, where she is on Klonopin and Xanax and while the patient, he is on gabapentin, in total the patient took 42 pills of 1-mg Klonopin, a total of 80 mg of Xanax and 147 pills of gabapentin. Upon presentation to ED, the patient was awake, but lethargic, he denies any suicidal thoughts or ideation. He reports, "I took them so I would feel right". Since then, patient has become more lethargic, but arousable to painful stimuli. The patient had blood workup done which did not show any significant lab abnormalities, besides salicylate level being at 15.6. The patient had admission in July of 2014 for similar presentation where he required intubation.   PAST MEDICAL HISTORY:  1. Hypertension.  2. Depression.  3. Anxiety.  4. Chronic leg pain.  5. History of overdose and substance abuse in the past.   ALLERGIES: No known drug allergies.   HOME MEDICATIONS:  1. Gabapentin 300 mg oral daily.  2. Hydrochlorothiazide 25 mg oral daily.  3. Lisinopril 20 mg oral daily. 4. Citalopram 40 mg oral daily. 5. Gabapentin 300 mg oral 3 capsules daily at bedtime and 300 mg 2 capsules 2 times a day morning and afternoon.   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 Drinks  alcohol on a daily basis. This information was obtained from his previous documentation.   FAMILY HISTORY: Unobtainable.   REVIEW OF SYSTEMS: Unobtainable as the patient is lethargic.   PHYSICAL EXAMINATION:  VITAL SIGNS: Temperature 98, pulse 65, respiratory rate 24, blood pressure 106/79, saturating 95% on oxygen.  GENERAL: A well-nourished male, sleeping comfortable in bed, snoring loudly.  HEENT: Head is atraumatic, normocephalic. Pupils equal, reactive to light. Pink conjunctivae. Anicteric sclerae. Moist oral mucosa.  NECK: Supple. No thyromegaly. No JVD.  CHEST: Good air entry bilaterally. No wheezing, rales, rhonchi.  CARDIOVASCULAR: S1, S2 heard. No rubs, murmurs, or gallops.  ABDOMEN: Soft, nontender, nondistended. Bowel sounds present.  EXTREMITIES: Good pedal pulses. Good femoral pulses. No lower extremity edema.  PSYCHIATRIC: Unable to evaluate as the patient is lethargic.  NEUROLOGIC: Unable to evaluate as the patient is lethargic but he is responsive to painful stimuli and moves all extremities.  SKIN: The patient does have some chronic discoloration of his bilateral lower extremities.   PERTINENT LABORATORY DATA: Glucose 103, BUN 11, creatinine 0.92, sodium 138, potassium 3.3, chloride 101, CO2 of 27, ALT 43, alkaline phosphatase 70, TSH 1.95. White blood cells 11, hemoglobin 16, hematocrit 46.4, platelets 281,000, salicylate 15.6, acetaminophen less than 2.   ASSESSMENT AND PLAN:  1. Acute encephalopathy/altered mental status. This is due to his overdose with Klonopin, Xanax, and gabapentin. Currently, the patient is arousable to loud verbal stimuli and painful stimuli. He appears to be able to protect his airways. He will be admitted to stepdown unit.  I will check ABG on him. I will continue with aggressive IV fluid hydration. Will monitor him on telemetry. Will monitor his vital signs and continue with supportive care. A psychiatric consult has been placed. We will have on  suicide precautions and sitter in the room even though the patient denies any suicidal ideation. He is IVC by ED staff.  2. Alcohol abuse. The patient will be started on CIWA protocol.  3. History of  polysubstance abuse. Will check urine drug screen.  4. History of depression and anxiety, the patient will be resumed back on his medications when he is more stable and cleared by psychiatry.  5. Deep vein thrombosis prophylaxis. Subcutaneous heparin.  CODE STATUS: Tried to contact the patient's wife over the phone with no answer. The patient will be kept full code.   please read addendum.   ____________________________ Starleen Arms, MD dse:lt D: 07/04/2014 05:21:05 ET T: 07/04/2014 05:36:30 ET JOB#: [id number]  cc: Starleen Arms, MD, <Dictator> Philipe Laswell Teena Irani MD ELECTRONICALLY SIGNED 07/04/2014 6:23

## 2015-04-14 NOTE — Consult Note (Signed)
Brief Consult Note: Diagnosis: S/P overdose.   Patient was seen by consultant.   Recommend further assessment or treatment.   Comments: Matthew Braun is no longer agitated. Please transfer to psychiatry. Call intake person [phone].  Electronic Signatures: Kristine LineaPucilowska, Reno Clasby (MD)  (Signed 16-Jul-15 08:44)  Authored: Brief Consult Note   Last Updated: 16-Jul-15 08:44 by Kristine LineaPucilowska, Khadir Roam (MD)

## 2015-04-14 NOTE — Consult Note (Signed)
Brief Consult Note: Diagnosis: Polysubstance dependence, s/p overdose on clonazepam.   Comments: Pt too agitated for interview. Will attempt later.  Electronic Signatures: Kristine LineaPucilowska, Maryama Kuriakose (MD)  (Signed 26-Apr-15 17:55)  Authored: Brief Consult Note   Last Updated: 26-Apr-15 17:55 by Kristine LineaPucilowska, Eastyn Dattilo (MD)

## 2015-04-14 NOTE — H&P (Signed)
PATIENT NAME:  Matthew Braun, Matthew MR#:  [id number] DATE OF BIRTH:  06/27/1961  DATE OF ADMISSION:  01/19/2014  IDENTIFYING INFORMATION AND CHIEF COMPLAINT: This is a 54 year old man with a history of substance abuse, mostly benzodiazepines in the past. He came into the hospital, evidently referred by his therapist, requesting detox. His chief complaint to me was "I don't know why I'm here."   HISTORY OF PRESENT ILLNESS: On interview today, the patient is telling me a very different story than what is documented when he came in last night. When he presented to the Emergency Room nurse last night, it was documented that he was told that he was needing detox because he had been taking pills. He said that he had been taking benzodiazepines and that he had passed out. He appeared to be admitting to some substance abuse. On interview with me today, the patient tells an inconsistent story that is different than what is documented in the chart. He tells me that his counselor, named Meriam SpragueBeverly, who works with [facility] was visiting him and his wife at home. He says that somehow she got the idea because he had taken his medication that he was abusing pills. The patient tells me that he no longer is prescribed any clonazepam and does not take any clonazepam at all. Also denies that he takes any other benzodiazepines. Denies that he has been drinking or abusing any drugs. He is claiming to me that the whole thing was some sort of misunderstanding and does not think that he needs any kind of detox or treatment at all. He says that his mood had been good, sleep had been good, and not been having suicidal ideation and not been having any symptoms really to speak of at all.   PAST PSYCHIATRIC HISTORY: This patient has a history of substance abuse, with benzodiazepines being prominent. He has been detoxed off them in the past. He has a history of getting very confused and almost delirious at times with overdoses of medication. From what I  can tell in the online database, it looks like he had recently been prescribed clonazepam, but only irregularly, by his outpatient psychiatrist and at a dose of 1 mg a day. The patient denies to me any history of suicide attempts. Denies any history of violence. He has been treated with citalopram and gabapentin as an outpatient. He is uncertain about what benefit they have had.   PAST MEDICAL HISTORY: High blood pressure. Also has a chronic skin condition, which looks similar to psoriasis, which evidently has not been given a clear diagnosis before.   SOCIAL HISTORY: He is married. Has no children. Lives with his wife. Not working. Says that he likes his life and that he stays busy during the day.   FAMILY HISTORY: He is not aware of any.   SUBSTANCE ABUSE HISTORY: History of abuse of alcohol for years. Also prescription medication, particularly benzodiazepines. No history of delirium tremens or seizures.   CURRENT MEDICATIONS: The patient is a little bit evasive about this, and I am not sure if I am getting a straight story. He is taking citalopram 20 mg per day, Neurontin 600 mg 4 times a day, hydrochlorothiazide 25 mg a day, Zestril 20 mg a day, trazodone 200 mg at night, and triamcinolone cream to his rash twice a day.   ALLERGIES: No known drug allergies.   REVIEW OF SYSTEMS: Currently he is pretty much denying all symptoms. Denies depression or suicidal ideation. Denies hopelessness. Denies any  psychotic symptoms. Does not have any specific medical complaints.   MENTAL STATUS EXAMINATION: A somewhat disheveled gentleman, looks older than his stated age. Cooperative with the interview. Good eye contact. Psychomotor activity a little slow. Speech normal rate, tone and volume. Affect somewhat constricted. Mood stated as being all right. Thoughts a little bit scattered and disorganized, not psychotic necessarily. No obvious delusions. Denies auditory or visual hallucinations. Denies suicidal or  homicidal ideation. Shows some impairment in insight and judgment about his substance abuse. Appears to be a little bit confused about the circumstances leading to hospitalization. Baseline intelligence average. Alert and oriented currently.   PHYSICAL EXAMINATION: SKIN: The patient does have patchy rash similar to psoriasis on his knees and some on his upper arms. No other acute skin lesions.  HEENT: Pupils equal and reactive. Face symmetric. Oral mucosa dry.  MUSCULOSKELETAL: Neck and back nontender. Full range of motion at all extremities. Normal gait.  NEUROLOGIC: Cranial nerves symmetric and normal.  LUNGS: He has diffuse wheezes throughout in all of his lung fields, especially the front.  HEART: Regular rate and rhythm.  ABDOMEN: Soft, nontender, normal bowel sounds.  VITAL SIGNS: Temperature 96.8, pulse 67, respirations 18, blood pressure 143/94.   LABORATORY RESULTS: Urinalysis unremarkable. Drug screen positive for benzodiazepines. Head CT done for "altered mental status" shows an area of encephalomalacia inferiorly in the left frontal lobe. A serum gabapentin level is pending. The TSH is normal at 2.5. Salicylates elevated at 11.1, probably indicating some recent use of higher doses of aspirin. Blood counts normal. Alcohol level negative. Chemistry panel shows elevated creatinine 1.3, low sodium 131, low chloride 97, elevated total protein 8.7. Acetaminophen negative.   ASSESSMENT: This is a 54 year old man with a history of substance abuse. Evidently, his counselor from [facility] was concerned that he was continuing to abuse drugs. There is some suggestion that he was perhaps delirious or oversedated prior to coming in. Today, he is changing his story and denies that he is abusing any drugs. He places all the blame on his counselor and claims to believe he is not having any substance abuse problems. He still appears to be a little sluggish.   TREATMENT PLAN: The patient was placed on some  standing doses of Librium, which can probably be quickly tapered and discontinued. Continue other medications as prescribed. Monitor for any signs of withdrawal. Try to get some collateral history from providers and perhaps family.   DIAGNOSIS, PRINCIPAL AND PRIMARY:  AXIS I: Polysubstance dependence with alcohol and prescription drugs.   SECONDARY DIAGNOSES: AXIS I: No further.  AXIS II: Deferred.  AXIS III: Hypertension, unknown skin condition.  AXIS IV: Severe.  AXIS V: Functioning at time of evaluation 40.    ____________________________ Audery Amel, MD jtc:jcm D: 01/19/2014 20:52:50 ET T: 01/19/2014 21:02:08 ET JOB#: [id number]  cc: Audery Amel, MD, <Dictator> Audery Amel MD ELECTRONICALLY SIGNED 01/19/2014 22:20

## 2015-04-14 NOTE — Discharge Summary (Signed)
PATIENT NAME:  Matthew Braun, Dreshon MR#:  [id number] DATE OF BIRTH:  1961-02-08  DATE OF ADMISSION:  01/19/2014 DATE OF DISCHARGE:  01/21/2014  HOSPITAL COURSE:  See dictated history and physical for details of admission.  This 54 year old gentleman with a history of substance abuse and mood and anxiety symptoms was admitted to the hospital on referral from his ACT team.  There was concern that he had overdosed on medication and was abusing benzodiazepines.  In the hospital, the patient maintained that he had not been trying to overdose or harm himself when he took clonazepam prior to admission.  He denied that he was abusing medication.  He minimized any recent symptoms and felt that it was unnecessary for him to be in the hospital.  He did not engage in any dangerous or suicidal behavior in the hospital.  He was generally cooperative with the treatment plan.  He did not show any signs of significant withdrawal or physical complications.  He was treated with daily individual and group psychotherapy and continued medication management.  He was taken off of his benzodiazepines, but was continued on citalopram, gabapentin and trazodone as his psychiatric medicine.  He felt comfortable with this and did not show any signs of benzodiazepine withdrawal or delirium.  At the time of discharge he denied any feelings of dangerousness and continued to be agreeable with outpatient management by the [facility] community support team.  He returned home to his family.   LABORATORY RESULTS:  Admission labs included a drug screen positive for benzodiazepines, otherwise negative.  TSH normal at 2.5.  Alcohol level negative.  Total protein slightly elevated at 8.7, creatinine elevated 1.3, sodium low 131, BUN elevated at 28.  CBC normal.  Urinalysis unremarkable.   DISCHARGE MEDICATIONS:  Lisinopril 20 mg once per day, gabapentin 600 mg 4 times a day, trazodone 200 mg at night, citalopram 20 mg once a day, triamcinolone topical cream twice  a day, hydrochlorothiazide 25 mg once a day.   MENTAL STATUS EXAMINATION AT DISCHARGE:  Casually dressed, neatly groomed man who looks his stated age.  Cooperative with the interview.  Good eye contact, normal psychomotor activity.  Speech normal rate, tone and volume.  Affect euthymic, reactive, appropriate.  Mood stated as fine.  Thoughts are lucid without loosening of associations or delusions.  Denies auditory or visual hallucinations.  Denies suicidal or homicidal ideation.  Shows adequate insight and judgment.  Normal intelligence.  Short-term memory intact.  Three out of three objects immediately and at three minutes.  Good fund of knowledge, normal long-term memory.  Alert and oriented x 4.   DIAGNOSIS:  AXIS I:  Polysubstance dependence with alcohol and prescription drugs.   SECONDARY DIAGNOSES: AXIS I:  Substance-induced mood disorder, resolving.  AXIS II:  Deferred.  AXIS III:  Hypertension, probably psoriasis.  AXIS IV:  Severe, chronic social and financial stress.  AXIS V:  Functioning at time of discharge 55.    ____________________________ Audery AmelJohn T. Clapacs, MD jtc:ea D: 02/09/2014 00:05:12 ET T: 02/09/2014 00:33:07 ET JOB#: [id number]  cc: Audery AmelJohn T. Clapacs, MD, <Dictator> Audery AmelJOHN T CLAPACS MD ELECTRONICALLY SIGNED 02/10/2014 14:24
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
## 2015-04-14 NOTE — Consult Note (Signed)
Brief Consult Note: Diagnosis: S/P overdose.   Comments: Patient intubated. Will return tomorrow.  Electronic Signatures: Kristine LineaPucilowska, Monica Codd (MD)  (Signed 14-Jul-15 11:04)  Authored: Brief Consult Note   Last Updated: 14-Jul-15 11:04 by Kristine LineaPucilowska, Salwa Bai (MD)

## 2015-04-14 NOTE — Discharge Summary (Signed)
PATIENT NAME:  Matthew Braun, Matthew Braun MR#:  [id number] DATE OF BIRTH:  02/06/1961  DATE OF ADMISSION:  09/06/2014 DATE OF DISCHARGE:  09/07/2014  PRIMARY CARE PHYSICIAN: Non-local.   DISCHARGE DIAGNOSES:  1.  Chronic obstructive pulmonary disease exacerbation.  2.  Hypertension.   3.  Tobacco abuse.   CODE STATUS: Full code.   CONDITION: Stable.   HOME MEDICATIONS: Please refer to the medication reconciliation list.   DIET: Low sodium diet.   ACTIVITY: As tolerated.   FOLLOWUP CARE: Follow up with PCP within 1-2 weeks.   REASON FOR ADMISSION: Shortness of breath.   HOSPITAL COURSE: The patient is a 54 year old male with a history of hypertension and COPD, came to the ED due to shortness of breath and cough.  The patient's O2 saturation was in the high 80s and improved to 94 with 2 liters oxygen. The patient's chest x-ray did not show any infiltrate. He was admitted for COPD exacerbation. For detailed history and physical examination please refer to admission note dictated by Dr. Heron NayVasireddy.  The patient's CAT scan of head did not show any acute intracranial abnormality.   1.  COPD exacerbation. The patient has been treated with Solu-Medrol, oxygen, and Zithromax with nebulizer treatment. The patient's symptoms have much improved. He denies any cough or sputum or shortness of breath.  2.  Tobacco abuse. The patient was counseled for smoking cessation.  3.  Hypertension has been treated with lisinopril and HCTZ, blood pressure is stable.  4.  Headache. The patient's CAT scan of head is negative. The  patient was treated with Percocet p.r.n.  The patient has no complaint except headache. Vital signs are stable. He is clinically stable and will be discharged to home today. I discussed the patient's discharge plan with the patient, nurse, case manager.   TIME SPENT: About 35 minutes.    ____________________________ Shaune PollackQing Connelly Spruell, MD qc:bu D: 09/07/2014 13:37:49 ET T: 09/07/2014 14:06:42  ET JOB#: [id number]  cc: Shaune PollackQing Frans Valente, MD, <Dictator> Shaune PollackQING Nevelyn Mellott MD ELECTRONICALLY SIGNED 09/07/2014 16:16

## 2015-04-14 NOTE — Consult Note (Signed)
Brief Consult Note: Diagnosis: Polysubstance dependence, s/p overdose on clonazepam.   Patient was seen by consultant.   Consult note dictated.   Recommend further assessment or treatment.   Orders entered.   Comments: [name] has a h/o polysubstance dependence and mood instability. He overdosed, unintentionally, on Clonazepam prescribed byu dr. Carman Braun. He is not suicidal or homicidal.   PLAN: 1. The patient no longer meets criteria for IVC. I will terminate proceedings. Please discharge as appropriate.   2. He is to continue all medications as presxcribed by Dr. Carman Braun. No Rx necessary.  3. F/U with Dr/ Moffet.  Electronic Signatures: Kristine LineaPucilowska, Jolanta (MD)  (Signed 27-Apr-15 12:41)  Authored: Brief Consult Note   Last Updated: 27-Apr-15 12:41 by Kristine LineaPucilowska, Jolanta (MD)

## 2015-04-14 NOTE — Consult Note (Signed)
PATIENT NAME:  Matthew Braun, Yechezkel MR#:  [id number] DATE OF BIRTH:  19-Mar-1961  DATE OF CONSULTATION:  04/17/2014  REFERRING PHYSICIAN:  Loraine LericheMark R. Fanny BienQuale, MD CONSULTING PHYSICIAN:  Emberleigh Reily B. Ogle Hoeffner, MD  REASON FOR CONSULTATION: To evaluate a patient after a suicide attempt.   IDENTIFYING DATA: [name] is a 54 year old male with history of substance abuse.   CHIEF COMPLAINT: "It was my wife."   HISTORY OF PRESENT ILLNESS: [name] has a long history of alcoholism and benzodiazepine abuse. He is a patient of [facility], who prescribes clonazepam 1 mg twice daily in addition to Neurontin and Celexa. The patient was brought to the Emergency Room by police. His wife noticed that 20 some pills of clonazepam were missing. The patient was sedated initially but then became belligerent, loud, obnoxious, drunk on clonazepam. I was unable to interview him yesterday. Neither could our intake nurse. Today the patient is pleasant, polite, and cooperative. He is trying to give me a complicated story of how in fact his wife is a benzo abuser and he had to hide his medications from her, so when she found an empty vial, it meant that the pills were stashed away somewhere else, not that he took it. However, his presentation with somnolence and then symptoms of being disinhibited, drunk on benzodiazepines, proves to the opposite. All the same, the patient adamantly denies that he was suicidal or that he attempted suicide. Initially when he first came to the hospital, he admitted that he was popping Klonopin for fun. He denies current alcohol use, although he was hospitalized at [facility] in March of this year for abuse of alcohol and benzodiazepines. He tells me that since discharge in March, he has been doing great, taking his medication, feeling good, and not drinking at all. He has no intention to change his provider or his medications and wants to continue on clonazepam, as he feels it is  very important for treatment of his anxiety. He has absolutely no complaints.   PAST PSYCHIATRIC HISTORY: There is a history of substance abuse, benzodiazepine and alcohol mostly. He denies any suicide attempt. He has been treated with Celexa and Neurontin, which he believes work well for him. He denies ever being in substance abuse treatment.   FAMILY PSYCHIATRIC HISTORY: None reported.   PAST MEDICAL HISTORY: Hypertension and psoriasis.   ALLERGIES: No known drug allergies.   MEDICATIONS ON ADMISSION: Neurontin 600 mg 3 times daily, hydrochlorothiazide 25 mg daily, lisinopril 20 mg daily, trazodone 200 mg at bedtime, citalopram 20 mg daily, clonazepam 1 mg twice daily.   SOCIAL HISTORY: He is married, lives with his wife. They have no children. He reports that he has to hide his pills from his wife. He has not been employed.   REVIEW OF SYSTEMS: CONSTITUTIONAL: No fevers or chills. No weight changes.  EYES: No double or blurred vision.  ENT: No hearing loss.  RESPIRATORY: No shortness of breath or cough.  CARDIOVASCULAR: No chest pain or orthopnea.  GASTROINTESTINAL: No abdominal pain, nausea, vomiting, or diarrhea.  GENITOURINARY: No incontinence or frequency.  ENDOCRINE: No heat or cold intolerance.  LYMPHATIC: No anemia or easy bruising.  INTEGUMENTARY: No acne or rash.  MUSCULOSKELETAL: No muscle or joint pain.  NEUROLOGIC: No tingling or weakness.  PSYCHIATRIC: See history of present illness for details.   PHYSICAL EXAMINATION: VITAL SIGNS: Blood pressure 139/68, pulse 64, respirations 16, temperature 96.7.  GENERAL: This is a well-developed, slightly obese male in  no acute distress.   The rest of the physical examination is deferred to his primary attending.   LABORATORY DATA: Chemistries are within normal limits. Blood sugar 153. Blood alcohol level is zero. LFTs within normal limits. TSH 3.18. Urine tox screen is positive for benzodiazepines and cannabinoids. CBC within  normal limits. Serum acetaminophen less than 2. Serum salicylates 4.6.   EKG: Normal sinus rhythm, normal EKG.   MENTAL STATUS EXAMINATION: The patient is alert and oriented to person, place, time, and situation. He is pleasant, polite, and cooperative. Very interactive, a bit funny. He is well groomed, wearing hospital scrubs and a yellow shirt. He maintains good eye contact. His speech is of normal rhythm, rate, and volume. Mood is fine with full affect. Thought process is logical and goal oriented. Thought content: He denies suicidal or homicidal ideation but was admitted after an overdose, probably accidental. There are no delusions or paranoia. There are no auditory or visual hallucinations. His cognition is grossly intact. His registration, recall, long and short-term memory are intact. He is of average intelligence and normal fund of knowledge. His insight and judgment are limited.   DIAGNOSES: AXIS I: Polysubstance dependence, substance-induced mood disorder.  AXIS II: Deferred.  AXIS III: Hypertension, psoriasis.  AXIS IV: Mental illness, substance abuse, poor insight.  AXIS V: Global Assessment of Functioning 55.   PLAN:  1.  The patient no longer meets criteria for involuntary inpatient psychiatric commitment. I will terminate proceedings. Please discharge as appropriate.  2.  He is to continue all medications as prescribed by his primary psychiatrist. No prescriptions given.  3.  He will follow up with [facility].    ____________________________ Ellin Goodie Jennet Maduro, MD jbp:jcm D: 04/17/2014 16:24:36 ET T: 04/17/2014 17:51:43 ET JOB#: [id number]  cc: Rozalyn Osland B. Jennet Maduro, MD, <Dictator> Shari Prows MD ELECTRONICALLY SIGNED 04/19/2014 23:51

## 2015-04-14 NOTE — Discharge Summary (Signed)
PATIENT NAME:  Matthew Braun, [name] MR#:  [id number] DATE OF BIRTH:  07-19-1961  DATE OF ADMISSION:  07/04/2014 DATE OF DISCHARGE:  07/06/2014  ADMITTING DIAGNOSIS: Altered mental status and lethargy.   DISCHARGE DIAGNOSES:  1.  Acute encephalopathy due to overdose on multiple medications including Klonopin, gabapentin, and Xanax.  2.  Acute respiratory failure requiring intubation for airway protection, now extubated.  3.  Hypertension.  4.  Depression.  5.  Anxiety.  6.  Chronic leg pain.  7.  History of overdose and substance abuse in the past.   CONSULTANTS: Dr. Erin FullingKurian Kasa and Dr. Toni Amendlapacs.   PERTINENT LABS AND EVALUATIONS: Admitting glucose 103, BUN 11, creatinine 0.92, sodium 138, potassium 3.3, chloride 101, CO2 was 27, calcium was 8.8. LFTs were normal. Urinalysis and benzodiazepines positive.  Cocaine positive.  Cannabinoids: THC positive. WBC 11.0, hemoglobin 16, platelet count 281,000. Chest x-ray was negative.   HOSPITAL COURSE: Please refer to H and P done by the admitting physician. The patient is a 54 year old Caucasian male with a history of polysubstance abuse in the past, who also has a history of depression who presented with altered mental status. The patient came in with overdose on Klonopin, Xanax, and gabapentin. The patient, on arrival to the ED, he was very lethargic and was failing to protect his airway; therefore, he was intubated. The patient was kept on the ventilator and was subsequently extubated on the 15th. The patient was monitored in the Intensive Care Unit, and was seen by psychiatry.  They recommended that he be transferred to Behavioral Medicine, which is being done today.   DISCHARGE MEDICATIONS: Lisinopril 20 daily, hydrochlorothiazide 25 daily, ibuprofen 800 q. 8 p.r.n. for pain, acetaminophen/oxycodone 325/5 q. 6 p.r.n. for pain.   DIET: Low sodium.   ACTIVITY: As tolerated.   FOLLOW-UP:  Follow with primary psychiatry in 1-2 weeks.   TIME SPENT: 35  minutes.    ____________________________ Lacie ScottsShreyang H. Allena KatzPatel, MD shp:ts D: 07/07/2014 14:21:22 ET T: 07/07/2014 22:00:43 ET JOB#: [id number]  cc: Azaiah Licciardi H. Allena KatzPatel, MD, <Dictator> Charise CarwinSHREYANG H Branden Shallenberger MD ELECTRONICALLY SIGNED 07/09/2014 9:48
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
## 2015-04-14 NOTE — Consult Note (Signed)
PATIENT NAME:  [name], [name] MR#:  [id number] DATE OF BIRTH:  1961-07-04  DATE OF CONSULTATION:  04/18/2014  REFERRING PHYSICIAN:  Loleta Rose, MD CONSULTING PHYSICIAN:  Ardeen Fillers. Garnetta Buddy, MD  REASON FOR CONSULTATION: "I'm not going down there. I took pills, Klonopin; I don't know how many."   HISTORY OF PRESENT ILLNESS:  The patient is a 54 year old Caucasian male who presented to the ED by the EMS as he was discharged yesterday and has history of substance abuse. He was discharged yesterday from the ED, but when he arrived again to the ED yesterday, he was yelling and reported that he does not know how many Klonopin he has taken. He initially reported that he had taken approximately 22 of the Klonopin to the EMS. He was asleep and was not aroused when his name was called. The clinician attempted to do the assessment. He was belligerent, yelling and agitated. He reported that, "You are asking me stupid questions. You need to leave."  He refused to answer most of the questions at that time.   During my interview, the patient reported that after his discharge from the ED yesterday, he went to the [facility] in [place] and got a refill on his prescriptions including Klonopin, gabapentin and meloxicam. Collateral information was obtained from his pharmacy and they reported that he filled a prescription of Klonopin , #30 pills, as well as meloxicam 15 mg, #30 pills. He got home, and the patient reported that his wife took some of his pills. He reported that his wife called EMS as she thought that he had taken 14 pills of the Klonopin. She thought that he was dying after taking the medications. He also drank approximately half of the 40 ounce. The patient reported that he was confused when he arrived to the ER. Now he feels better. However, the patient was minimizing the use of alcohol and the Klonopin at this time. He reported that he follows with Dr. Marguerite Olea at [facility]. He also has an appointment  with Lorella Nimrod tomorrow. He continues to minimize his use of alcohol and Klonopin at this time.   PAST PSYCHIATRIC HISTORY: The patient has long history of substance abuse and benzodiazepine dependence. He denied any suicide attempts. He has been treated with a combination of Celexa and Neurontin in the past. He denied any history of substance abuse in the past.   FAMILY PSYCHIATRIC HISTORY:  None reported.   MEDICAL HISTORY:  Hypertension and psoriasis.   ALLERGIES:  No known drug allergies.   CURRENT MEDICATIONS:  Neurontin 600 mg 3 times daily, hydrochlorothiazide 25 mg daily, lisinopril 20 mg daily, trazodone 200 at bedtime, citalopram 20 mg daily, clonazepam 1 mg twice daily.   SOCIAL HISTORY:  The patient is currently married and lives with his wife. They do not have any children. He reported that he has to hide his pills from his wife, as she has been prescribed the same medication, and when she runs out of her pills, then she started abusing his pills. However, it appears that the patient has been taking more than the amount prescribed of his current medications.   REVIEW OF SYSTEMS: CONSTITUTIONAL:  Denies any fever or chills. No weight changes.  EYES:  No double or blurred vision.  RESPIRATORY:  No shortness of breath or cough.  CARDIOVASCULAR:  No chest pain or orthopnea.  GASTROINTESTINAL:  No abdominal pain, nausea, vomiting or diarrhea.  GENITOURINARY:  No incontinence or frequency.  ENDOCRINE:  No heat or cold  intolerance.  LYMPHATIC:  No anemia or easy bruising.  INTEGUMENTARY:  No acne or rash.  MUSCULOSKELETAL:  No muscle or joint pain.  NEUROLOGIC:  No tingling or weakness.   PHYSICAL EXAMINATION: VITAL SIGNS:  Temperature 97.8, pulse 102, respirations 19, blood pressure 118/75.   LABORATORY DATA: Glucose 120, BUN 13, creatinine 0.7, sodium 137, potassium 3.4, chloride 102, bicarbonate 27, anion gap 8, osmolality 275, calcium 9.0, magnesium 2.4, lipase 274. Blood alcohol  level 134. Protein 8.2, albumin 4.4, bilirubin 0.5, AST 37, ALT 40. Troponin less than 0.02. UDS is positive for cannabinoids. WBC 7.3. Hemoglobin is 16.4. Hematocrit is 47.3. RDW is 13.8.   MENTAL STATUS EXAMINATION: The patient is a moderately-built male who appeared his stated age. He was pleasant, awake, alert during the interview. He maintained fair eye contact. His speech was normal in tone and volume. Mood was fine and affect was congruent. Thought process was logical, goal-directed. Thought content is nondelusional. He currently denied having any suicidal ideations or plans. He denied having any auditory or visual hallucinations. Cognition is intact. His insight and judgment are poor.   DIAGNOSTIC IMPRESSION: AXIS I: 1.  Polysubstance dependence.  2.  Major depressive disorder, recurrent, moderate.  AXIS III: Hypertension, psoriasis.   TREATMENT PLAN:  The patient is currently on involuntary commitment and will be transferred to [facility] when a bed becomes available, as he seems to have major issue with abusing his Klonopin, as well as alcohol at this time. He has presented to the ER 2 days in a row and has been minimizing his use of Klonopin at this time. I discussed with the patient about the same, and he will be provided information about [facility].   Thank you for allowing me to participate in the care of this patient.   ____________________________ Ardeen FillersUzma S. Garnetta BuddyFaheem, MD usf:dmm D: 04/18/2014 13:17:48 ET T: 04/18/2014 13:25:57 ET JOB#: [id number]  cc: Ardeen FillersUzma S. Garnetta BuddyFaheem, MD, <Dictator> Rhunette CroftUZMA S Blanchard Willhite MD ELECTRONICALLY SIGNED 04/25/2014 13:02

## 2015-04-14 NOTE — Consult Note (Signed)
PATIENT NAME:  Matthew BrownsBARBOUR, [name] MR#:  [id number] DATE OF BIRTH:  1960-12-29  DATE OF CONSULTATION:  07/05/2014  REFERRING PHYSICIAN:   CONSULTING PHYSICIAN:  Audery AmelJohn T. Lakelyn Straus, MD  IDENTIFYING INFORMATION AND REASON FOR CONSULT: This is a 54 year old gentleman with a history of substance abuse. He was brought into the Emergency Room yesterday due to altered mental status. The patient's wife reported that he had taken an overdose of her medication. According to the intake, it was estimated that he took 42 one milligram clonazepams, perhaps as much as 80 mg of Xanax and 147 gabapentins . He was very lethargic and was denying suicidal ideation. Ultimately, he was admitted to the Critical Care Unit and was briefly intubated. The patient is not a good historian at this time. Denies depression. Denies suicidal ideation.  Denies, in fact, that he took the overdose at all. He is uncooperative and not willing to give much more information.   PAST PSYCHIATRIC HISTORY: This is a gentleman who has a known long-standing history of substance abuse, particularly benzodiazepines and alcohol. He has been through our Emergency Room and Hospital multiple times. He has a history of taking very large overdoses, in a similar manner such as this, and it usually appears to be not so much being suicidal as being a very miss guided attempt to get intoxicated. The patient has poor insight and has not been able to stay clean off his medication for any length of time, in part because he continues to get prescribed it as an outpatient. Not clear whether he has any other mood disorder.   PAST MEDICAL HISTORY: Chronic pain in the leg of unclear etiology to me and history of hypertension as well.   SOCIAL HISTORY: He is disabled, lives at home with his wife.   CURRENT MEDICATIONS: Gabapentin 300 mg once a day, hydrochlorothiazide 25 mg once a day, lisinopril 20 mg per day, citalopram 40 mg a day, gabapentin, also listed here as 300 mg 3  capsules at bedtime, and 600 mg twice a day. I do not see it listed whether he is currently being prescribed benzodiazepines.   ALLERGIES: NO KNOWN DRUG ALLERGIES.   REVIEW OF SYSTEMS: He complains of his leg hurting, but otherwise, has no specific complaints and is uncooperative with the review of systems. Denies suicidal ideation. Denies any hallucinations.   MENTAL STATUS EXAMINATION: Disheveled, sick-looking, agitated gentleman. Interviewed in the Critical Care Unit. Eye contact intermittent. Psychomotor activity lethargic, but at the same time, somewhat agitated. Mood stated as being angry. Affect is irritated and labile. Thoughts are disorganized. He does not say anything obviously bizarre, but it is questionable how oriented he is and he tends to ramble and repeat himself. Denies suicidal or homicidal ideation. Does not seem to be hallucinating. Full cognitive exam uncooperative, but he is oriented to his current basic situation of being in the hospital and to who is around him.   LABORATORY RESULTS: EKG unremarkable.  Acetaminophen negative. Chemistry panel: Low potassium 3.3. Alcohol level 35, white count slightly elevated at 11, otherwise, normal CBC. salicylates elevated at 15.6. Urinalysis unremarkable.  Drug screen positive for cocaine, cannabis, and benzodiazepines.   ASSESSMENT: A 54 year old male with a long history of substance abuse and impulsive overdoses. Once again, he has taken what appears to be a very large overdose of medication and wound up in the Critical Care Unit. Currently, he is somewhat delirious, in part probably from her intoxication and may be from some withdrawal already. He  is agitated and argumentative and uncooperative. The patient is not safe to go home at this point. Needs further stabilization for his overdose.   TREATMENT PLAN: Worked with patient for a while trying to calm him down with verbal redirection. He has been given Ativan IV and this can be repeated  until he is able to calm down. We anticipate likely transfer her to the psychiatry ward.   DIAGNOSIS, PRINCIPAL AND PRIMARY:  AXIS I: Delirium, secondary to benzodiazepine overdose and Neurontin overdose.   SECONDARY DIAGNOSES:  AXIS I:  1. Benzodiazepine dependence.  2. Alcohol dependence.  3. Cocaine abuse.  AXIS II: Deferred.  AXIS III: History of high blood pressure.  AXIS IV: Moderate chronic stress from his substance abuse and poor insight.  AXIS V: Functioning at time of evaluation is 25.    ____________________________ Audery Amel, MD jtc:ts D: 07/05/2014 16:44:36 ET T: 07/05/2014 17:04:03 ET JOB#: [id number]  cc: Audery Amel, MD, <Dictator> Audery Amel MD ELECTRONICALLY SIGNED 07/09/2014 12:41

## 2015-04-14 NOTE — H&P (Signed)
PATIENT NAME:  Matthew Braun, Matthew Braun MR#:  [id number] DATE OF BIRTH:  1961/07/05  DATE OF ADMISSION:  09/06/2014  PRIMARY CARE PHYSICIAN: Nonlocal.   REFERRING PHYSICIAN: Dr. Gladstone Pihavid Schaevitz.    CHIEF COMPLAINT: Shortness of breath.   HISTORY OF PRESENT ILLNESS: Matthew Braun is a 54 year old male history of therapy. benzodiazepine dependence, continued tobacco use, depression, hypertension, who comes to the Emergency Department with complaints of shortness of breath. The patient states has been coughing without much productive sputum. Concerning this, came to the Emergency Department. In the Emergency Department, the patient's oxygen saturations was in the high 80s improved to 94 with 2 liters of oxygen. The patient is quite somnolent. Denies having any fever. Denies having any nausea, vomiting, abdominal pain. Chest x-ray does not show any infiltrates.   PAST MEDICAL HISTORY:  1. Hypertension.  2. Depression.  3. Anxiety.  4. Chronic leg pain.  5. Previous history of overdose substance abuse in the past.   ALLERGIES: No known drug allergies.   HOME MEDICATIONS:  1. Trazodone 100 mg 2 tablets at bedtime.  2. Mirtazapine 15 mg at bedtime.  3. Lisinopril 20 mg once a day. 4. Hydrochlorothiazide 1 tablet once a day.  5. Gabapentin 300 mg 2 times a day.  6. Gabapentin 600 mg.  7. Citalopram 40 mg daily.  8. Alprazolam 1 mg once a day.   SOCIAL HISTORY: Continues to smoke 1 to 2 packs a day. Drinks alcohol 2 to 3 beers a day. Denies using any illicit drugs. Married, lives with his wife, disabled.   FAMILY HISTORY: Heart problems.   REVIEW OF SYSTEMS:  CONSTITUTIONAL: Generalized weakness.  EYES: No change in vision.  EARS, NOSE AND THROAT: No change in hearing.  RESPIRATORY: Has cough, shortness of breath.  CARDIOVASCULAR: No chest pain, palpations.  GASTROINTESTINAL: No nausea, vomiting, abdominal pain.  GENITOURINARY: No dysuria or hematuria.  HEMATOLOGIC: No easy bruising or bleeding.   SKIN: No rash or lesions.  MUSCULOSKELETAL: No joint pains and aches.  NEUROLOGIC: No weakness or numbness in any part of the body.   PHYSICAL EXAMINATION:  GENERAL: This is a well-built, well-nourished, age-appropriate male lying down in the bed, not in distress.  VITAL SIGNS: Temperature 98.3, pulse 91, blood pressure 109/68, respiratory rate of 22, oxygen saturation 91% on room air.  HEENT: Head normocephalic, atraumatic. There is no scleral icterus. Conjunctivae normal. Pupils equal and react to light. Mucous membranes moist. No pharyngeal erythema.  NECK: Supple. No lymphadenopathy. No JVD. No carotid bruit.  CHEST: Has no focal tenderness.  LUNGS: Bilaterally good air entry. Occasional wheezing, mild prolonged expiratory phase.  HEART: S1, S2 regular. No murmurs are heard.  ABDOMEN: Bowel sounds present. Soft, nontender, nondistended.  EXTREMITIES: No pedal edema. Pulses 2+.  NEUROLOGIC: The patient is alert, oriented to place, person, and time. Cranial nerves II through XII intact. Motor 5/5 in upper and lower extremities.   LABORATORY DATA: CBC is within normal limits. Complete metabolic panel is completely within normal limits, including cardiac enzymes. Chest x-ray, one view portable: Increased markings consistent with subsegmental atelectasis.   A CT head without contrast: No acute intracranial abnormalities.    ASSESSMENT AND PLAN: [name] is a 54 year old male who comes with chronic obstructive pulmonary disease exacerbation.   ASSESSMENT AND PLAN:  1. Chronic obstructive pulmonary disease exacerbation. Continue the breathing treatments of Solu-Medrol, Rocephin and Zithromax.  2. Continued tobacco use. Counseled with the patient. The patient seems to have poor understanding.  3. Hypertension.  Hold the hydrochlorothiazide. Continue with lisinopril.  4. Depression. Continue the trazodone and mirtazapine.  5. Keep the patient on deep vein thrombosis prophylaxis with  Lovenox.   TIME SPENT: 50 minutes.    ____________________________ Matthew Griffins, MD pv:JT D: 09/07/2014 03:10:59 ET T: 09/07/2014 03:40:33 ET JOB#: [id number]  cc: Matthew Griffins, MD, <Dictator> Matthew Griffins MD ELECTRONICALLY SIGNED 09/20/2014 21:03

## 2015-06-02 IMAGING — CT CT HEAD WITHOUT CONTRAST
1 series · 15 of 30 positions shown, 19 images · non-contrast
Comparison: CT HEAD W/O CM dated 01/18/2014

CLINICAL DATA: Overdose, chest pain, shortness of breath

EXAM:
CT HEAD WITHOUT CONTRAST
TECHNIQUE: Contiguous axial images were obtained from the base of the skull
through the vertex without intravenous contrast.

[Series 2: soft tissue · axial · 0.42mm/px · z∈[-123,+17]mm · 15 of 32 slices shown, 19 images]
[im 2/32  brain]
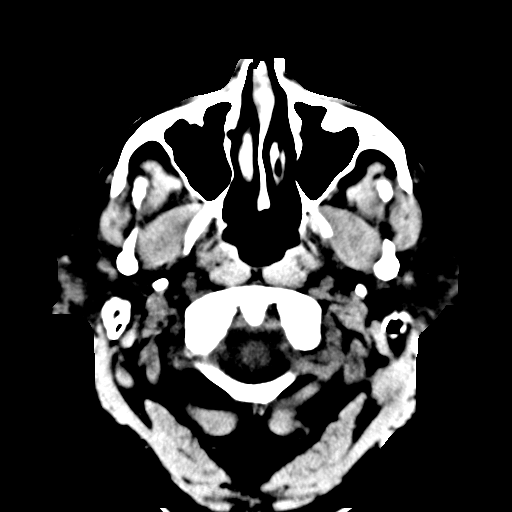
[im 2/32  bone]
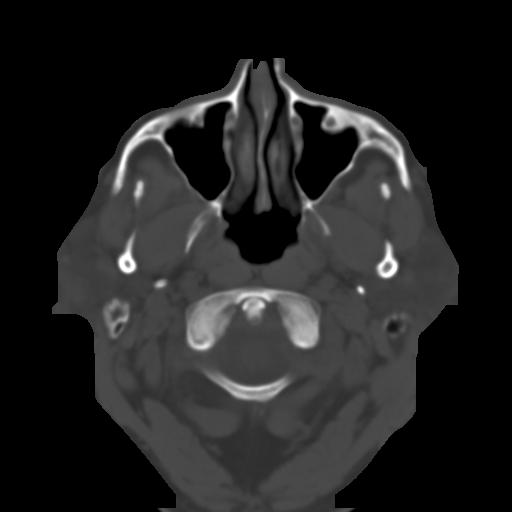
[im 4/32  brain]
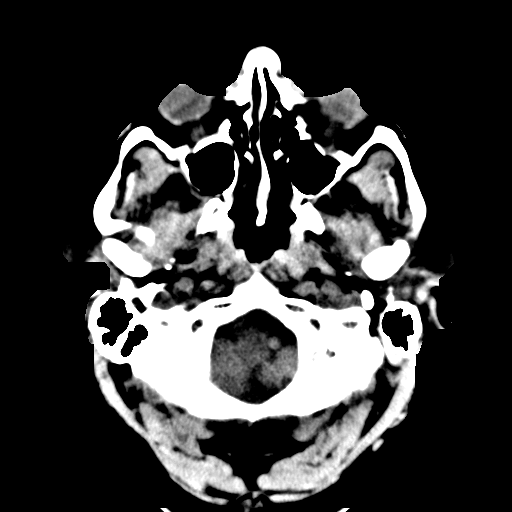
[im 6/32  brain]
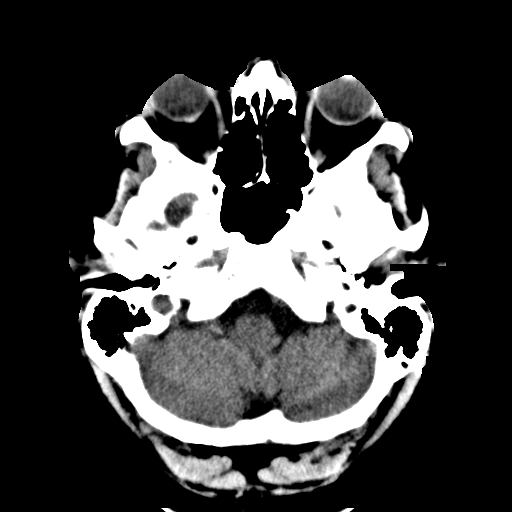
[im 8/32  brain]
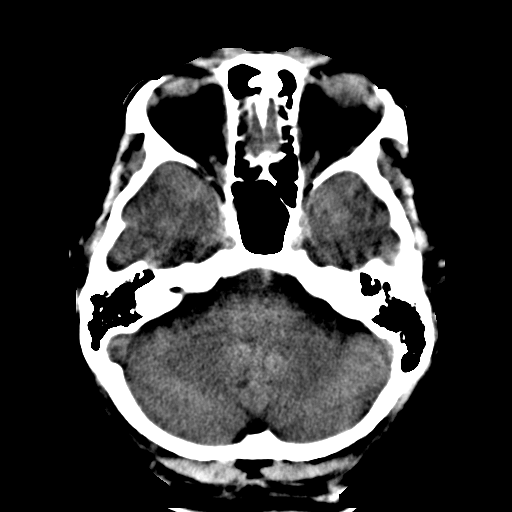
[im 10/32  brain]
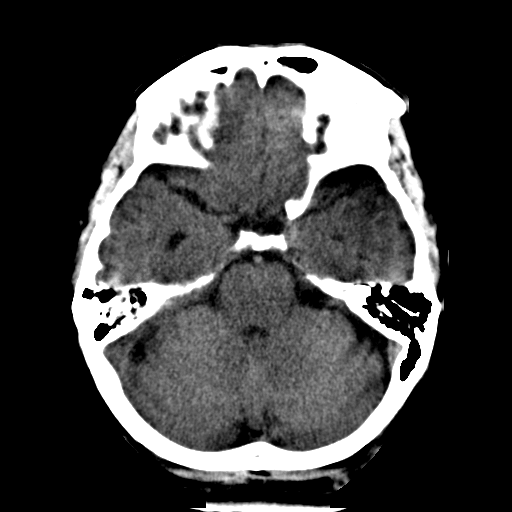
[im 10/32  bone]
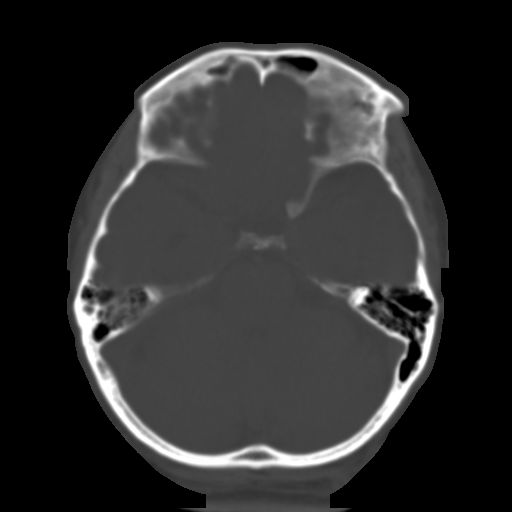
[im 12/32  brain]
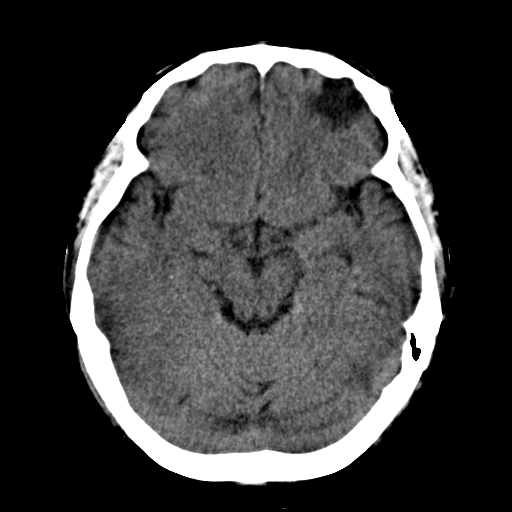
[im 14/32  brain]
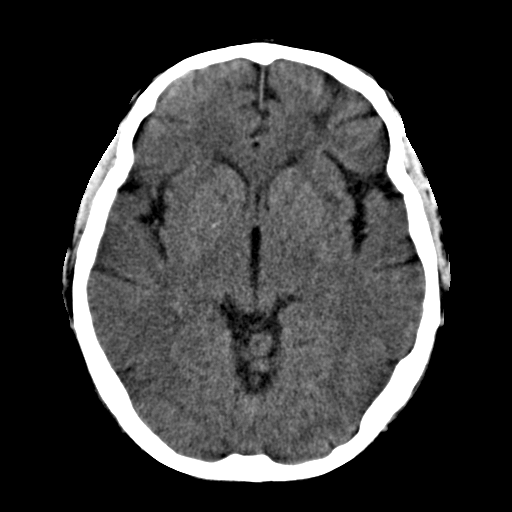
[im 17/32  brain]
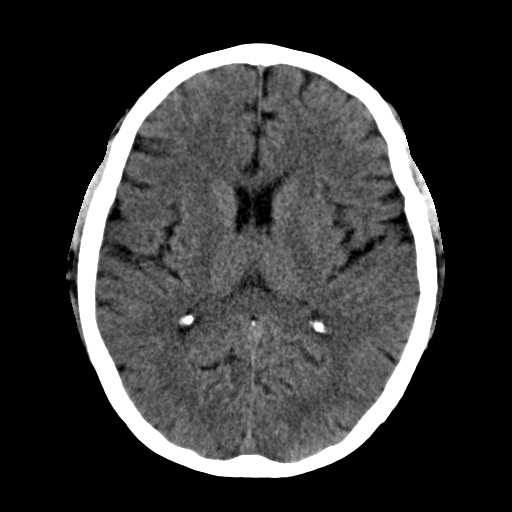
[im 18/32  brain]
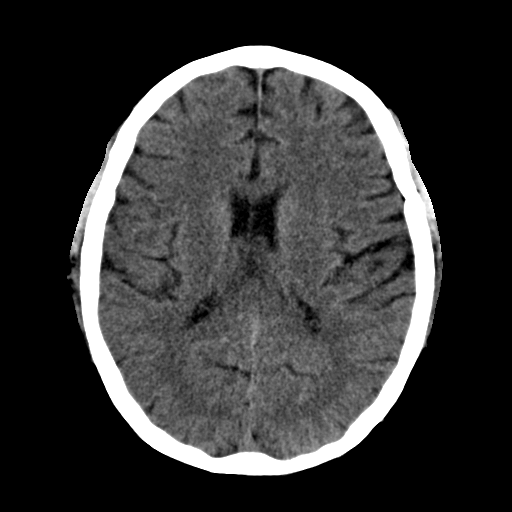
[im 18/32  bone]
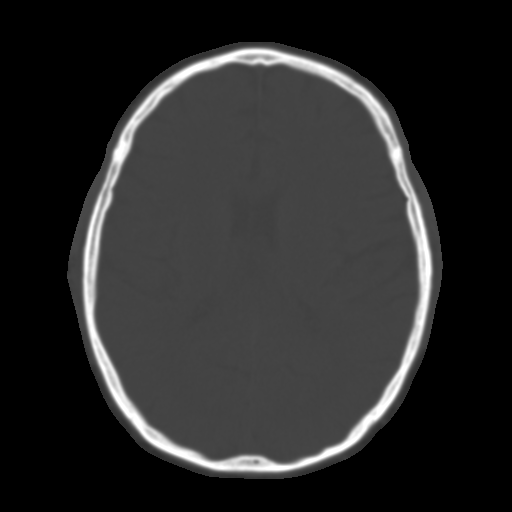
[im 20/32  brain]
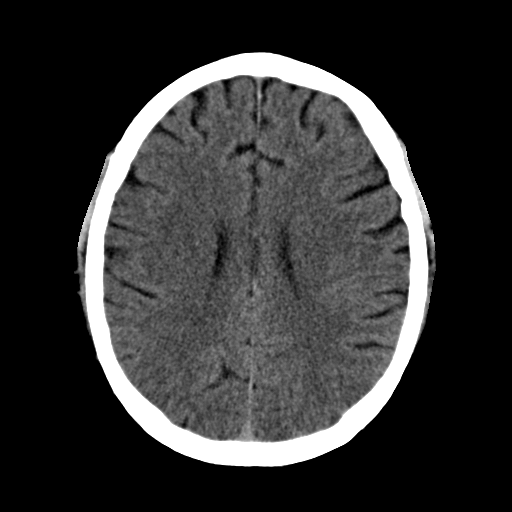
[im 22/32  brain]
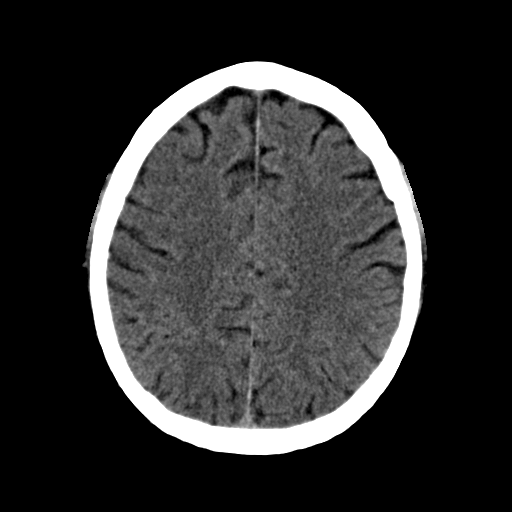
[im 24/32  brain]
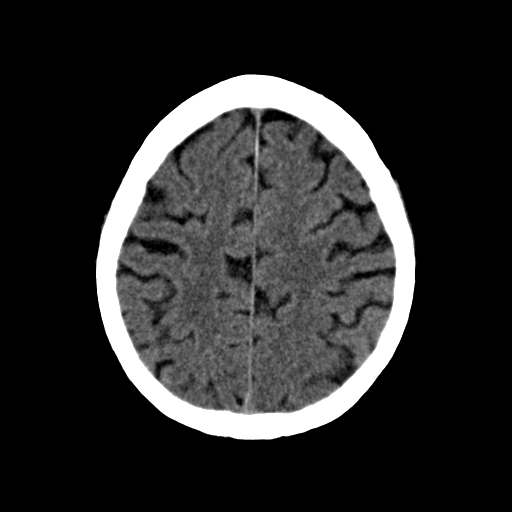
[im 26/32  brain]
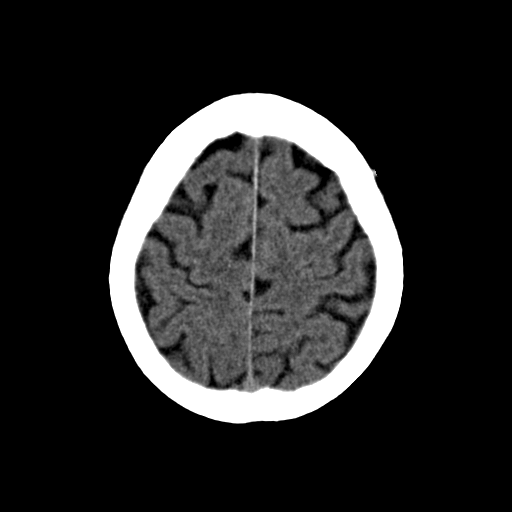
[im 26/32  bone]
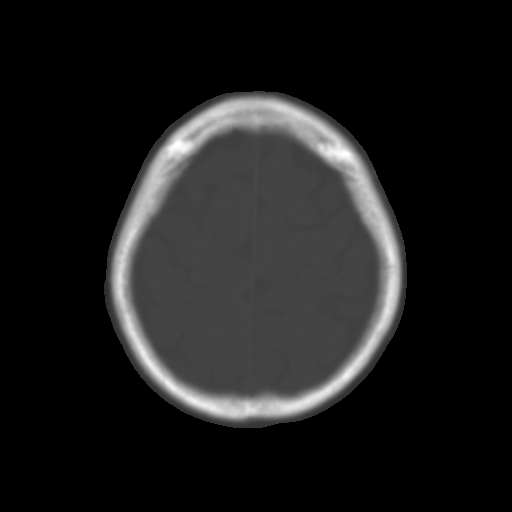
[im 28/32  brain]
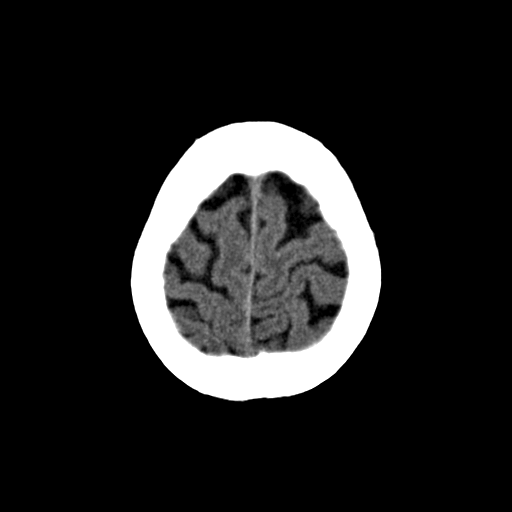
[im 30/32  brain]
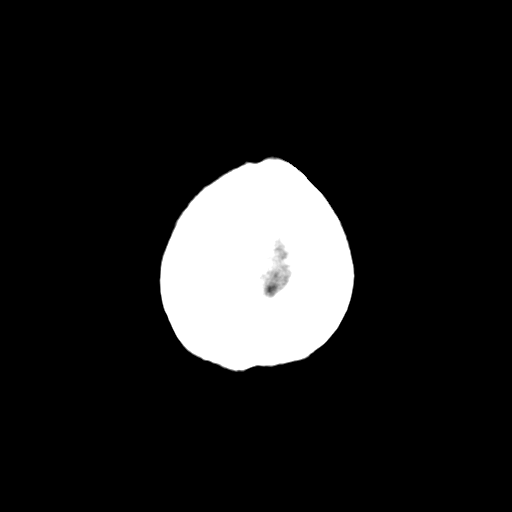

[15 of 30 positions shown; findings below may reference images not displayed]

FINDINGS: Grossly unchanged geographic area of encephalomalacia involving the
subcortical aspect of the left frontal lobe (image 12, series 2).
Gray-white differentiation is otherwise well maintained without CT
evidence of acute large territory infarct. No intraparenchymal or
extra-axial mass or hemorrhage. Unchanged size and configuration of
the ventricles and basilar cisterns. No midline shift. Limited
visualization of the paranasal sinuses and mastoid air cells are
normal. Unchanged old/remote minimally displaced left-sided nasal
bone fracture. Regional soft tissues appear normal. No displaced
calvarial fracture.
IMPRESSION: 1. No acute intracranial process.
2. Stable geographic area encephalomalacia involving the subcortical
aspect of the left frontal lobe, nonspecific, though favored to be
the sequela of prior infarction and/or contusion.
